# Patient Record
Sex: Male | Born: 1950
Health system: Southern US, Community
[De-identification: ages and names within clinical notes are randomized; demographics above are authoritative.]

## PROBLEM LIST (undated history)

## (undated) DIAGNOSIS — Z87442 Personal history of urinary calculi: Secondary | ICD-10-CM

## (undated) DIAGNOSIS — E785 Hyperlipidemia, unspecified: Secondary | ICD-10-CM

## (undated) DIAGNOSIS — R011 Cardiac murmur, unspecified: Secondary | ICD-10-CM

## (undated) DIAGNOSIS — I219 Acute myocardial infarction, unspecified: Secondary | ICD-10-CM

## (undated) DIAGNOSIS — I2510 Atherosclerotic heart disease of native coronary artery without angina pectoris: Secondary | ICD-10-CM

## (undated) DIAGNOSIS — E119 Type 2 diabetes mellitus without complications: Secondary | ICD-10-CM

## (undated) DIAGNOSIS — N189 Chronic kidney disease, unspecified: Secondary | ICD-10-CM

## (undated) DIAGNOSIS — Z905 Acquired absence of kidney: Secondary | ICD-10-CM

## (undated) DIAGNOSIS — I519 Heart disease, unspecified: Secondary | ICD-10-CM

## (undated) DIAGNOSIS — I10 Essential (primary) hypertension: Secondary | ICD-10-CM

## (undated) DIAGNOSIS — M109 Gout, unspecified: Secondary | ICD-10-CM

## (undated) HISTORY — PX: NEPHRECTOMY: SHX65

## (undated) HISTORY — PX: CORONARY ANGIOPLASTY WITH STENT PLACEMENT: SHX49

## (undated) HISTORY — DX: Heart disease, unspecified: I51.9

## (undated) HISTORY — DX: Essential (primary) hypertension: I10

## (undated) HISTORY — PX: EYE SURGERY: SHX253

## (undated) HISTORY — DX: Type 2 diabetes mellitus without complications: E11.9

## (undated) HISTORY — DX: Gout, unspecified: M10.9

---

## 2016-06-07 DIAGNOSIS — I2583 Coronary atherosclerosis due to lipid rich plaque: Secondary | ICD-10-CM | POA: Diagnosis not present

## 2016-06-07 DIAGNOSIS — E785 Hyperlipidemia, unspecified: Secondary | ICD-10-CM | POA: Diagnosis not present

## 2016-06-07 DIAGNOSIS — I10 Essential (primary) hypertension: Secondary | ICD-10-CM | POA: Diagnosis not present

## 2016-06-21 DIAGNOSIS — Z0001 Encounter for general adult medical examination with abnormal findings: Secondary | ICD-10-CM | POA: Diagnosis not present

## 2016-06-21 DIAGNOSIS — E782 Mixed hyperlipidemia: Secondary | ICD-10-CM | POA: Diagnosis not present

## 2016-06-21 DIAGNOSIS — Z1211 Encounter for screening for malignant neoplasm of colon: Secondary | ICD-10-CM | POA: Diagnosis not present

## 2016-06-21 DIAGNOSIS — I10 Essential (primary) hypertension: Secondary | ICD-10-CM | POA: Diagnosis not present

## 2016-07-12 ENCOUNTER — Other Ambulatory Visit: Payer: Self-pay

## 2016-08-17 ENCOUNTER — Ambulatory Visit: Payer: PPO | Admitting: Urology

## 2016-08-17 ENCOUNTER — Encounter: Payer: Self-pay | Admitting: Urology

## 2016-08-17 VITALS — BP 150/81 | HR 88 | Ht 70.0 in | Wt 186.8 lb

## 2016-08-17 DIAGNOSIS — Z125 Encounter for screening for malignant neoplasm of prostate: Secondary | ICD-10-CM | POA: Diagnosis not present

## 2016-08-17 DIAGNOSIS — R3129 Other microscopic hematuria: Secondary | ICD-10-CM

## 2016-08-17 DIAGNOSIS — N469 Male infertility, unspecified: Secondary | ICD-10-CM

## 2016-08-17 DIAGNOSIS — N401 Enlarged prostate with lower urinary tract symptoms: Secondary | ICD-10-CM

## 2016-08-17 DIAGNOSIS — N200 Calculus of kidney: Secondary | ICD-10-CM | POA: Diagnosis not present

## 2016-08-17 LAB — BLADDER SCAN AMB NON-IMAGING: SCAN RESULT: 14

## 2016-08-17 NOTE — Progress Notes (Signed)
08/17/2016 3:37 PM   Zachary Gordon [birth year] [id number]  Referring provider: Lavera Guise, [place],  [id number]  Chief Complaint  Patient presents with  . Benign Prostatic Hypertrophy     HPI: The patient is a 66 year old gentle man with a past medical history of left nephrectomy and BPH on finasteride presents today with difficulty urinating.    1. BPH The patient notes a weak stream, urinary frequency, nocturia, intermittency, and straining to urinate. He has been on finasteride until 1 month ago when he stopped it as he thought it may be impairing his ability to have a child with his wife. He has never tried Flomax before. He does feel this bothers his quality of life overall.  2. Infertility The patient has been trying to conceive a child with his wife for 7 years. She has been worked up and was told that she is normal. She is 65 years of age. He has had a semen analysis in the past and was told that he had reduced sperm counts. They have tried in vitro fertilization 3 without success. He was on finasteride until approximately one month ago when he stopped his he thought it is impairing his chances of fertility. He has noticed that his semen has started to get thicker and less clear since stopping this medication.  3. History of nephrolithiasis This had multiple surgeries including open pilonidal lithotomy resulting loss of left kidney. His most recent stone episodes in 2010 which was treated with a stent followed by lithotripsy. His urinalysis today does show calcium oxalate crystals as well as 3-10 red blood cells.  4. History of left nephrectomy His left kidney was removed as result of an iatrogenic genic injury during stone surgery many years ago. It was not removed for malignant reasons.  5. Prostate cancer screening PSA was 0.6 in May 2018.  PMH: Past Medical History:  Diagnosis Date  . Gout   . Heart disease   . Hypertension     Surgical  History: Past Surgical History:  Procedure Laterality Date  . CORONARY ANGIOPLASTY WITH STENT PLACEMENT    . NEPHRECTOMY Left     Home Medications:  Allergies as of 08/17/2016   No Known Allergies     Medication List       Accurate as of 08/17/16  3:37 PM. Always use your most recent med list.          amLODipine 5 MG tablet Commonly known as:  NORVASC   carvedilol 6.25 MG tablet Commonly known as:  COREG   finasteride 5 MG tablet Commonly known as:  PROSCAR   lisinopril 10 MG tablet Commonly known as:  PRINIVIL,ZESTRIL   rosuvastatin 20 MG tablet Commonly known as:  CRESTOR       Allergies: No Known Allergies  Family History: Family History  Problem Relation Age of Onset  . Prostate cancer Neg Hx   . Bladder Cancer Neg Hx   . Kidney cancer Neg Hx     Social History:  reports that he has never smoked. He has never used smokeless tobacco. He reports that he does not drink alcohol or use drugs.  ROS: UROLOGY Frequent Urination?: Yes Hard to postpone urination?: Yes Burning/pain with urination?: No Get up at night to urinate?: Yes Leakage of urine?: No Urine stream starts and stops?: Yes Trouble starting stream?: No Do you have to strain to urinate?: Yes Blood in urine?: No Urinary tract infection?: No Sexually transmitted disease?: No  Injury to kidneys or bladder?: No Painful intercourse?: No Weak stream?: Yes Erection problems?: No Penile pain?: No  Gastrointestinal Nausea?: No Vomiting?: No Indigestion/heartburn?: No Diarrhea?: No Constipation?: No  Constitutional Fever: No Night sweats?: No Weight loss?: No Fatigue?: No  Skin Skin rash/lesions?: No Itching?: No  Eyes Blurred vision?: No Double vision?: No  Ears/Nose/Throat Sore throat?: No Sinus problems?: No  Hematologic/Lymphatic Swollen glands?: No Easy bruising?: No  Cardiovascular Leg swelling?: No Chest pain?: No  Respiratory Cough?: No Shortness of breath?:  No  Endocrine Excessive thirst?: No  Musculoskeletal Back pain?: No Joint pain?: No  Neurological Headaches?: No Dizziness?: No  Psychologic Depression?: No Anxiety?: No  Physical Exam: BP (!) 150/81 (BP Location: Left Arm, Patient Position: Sitting, Cuff Size: Normal)   Pulse 88   Ht 5\' 10"  (1.778 m)   Wt 186 lb 12.8 oz (84.7 kg)   BMI 26.80 kg/m   Constitutional:  Alert and oriented, No acute distress. HEENT: [place] AT, moist mucus membranes.  Trachea midline, no masses. Cardiovascular: No clubbing, cyanosis, or edema. Respiratory: Normal respiratory effort, no increased work of breathing. GI: Abdomen is soft, nontender, nondistended, no abdominal masses GU: No CVA tenderness. Normal phallus. Testicles somewhat small but benign. DRE 30 g benign. Skin: No rashes, bruises or suspicious lesions. Lymph: No cervical or inguinal adenopathy. Neurologic: Grossly intact, no focal deficits, moving all 4 extremities. Psychiatric: Normal mood and affect.  Laboratory Data: No results found for: WBC, HGB, HCT, MCV, PLT  No results found for: CREATININE  No results found for: PSA  No results found for: TESTOSTERONE  No results found for: HGBA1C  Urinalysis No results found for: COLORURINE, APPEARANCEUR, LABSPEC, PHURINE, GLUCOSEU, HGBUR, BILIRUBINUR, KETONESUR, PROTEINUR, UROBILINOGEN, NITRITE, LEUKOCYTESUR  Assessment & Plan:    1. BPH I have recommended against any medical or surgical management of this currently as this would further impair his fertility. We will address this once he is able to conceive a child or when he no longer desires conception.  2. Infertility I discussed the patient that his finasteride certainly was not helping with his fertility issues. We discussed that finasteride takes up to 6 months for its affects to completely cease. We will have him obtain a complete semen analysis in 5 months in follow-up in 6 months to discuss the results. Hopefully  stopping finasteride will help with his fertility.  3. History of nephrolithiasis The patient has calcium oxalate crystals as well as microscopic hematuria on his urinalysis today. He is high risk for nephrolithiasis given his solitary kidney. We'll obtain a renal ultrasound prior to follow-up.  4. Microscopic hematuria Likely related to known calcium oxalate crystals. If he does not have evidence of nephrolithiasis on his ultrasound, we will proceed with a more formal workup.  5. Prostate cancer screening Up-to-date  Return in about 6 months (around 02/16/2017) for semen analysis and renal u/s prior.  Nickie Retort, MD  [facility] [address] [place], [place] [address] [phone]

## 2016-08-18 LAB — MICROSCOPIC EXAMINATION

## 2016-08-18 LAB — URINALYSIS, COMPLETE
Bilirubin, UA: NEGATIVE
Glucose, UA: NEGATIVE
Ketones, UA: NEGATIVE
LEUKOCYTES UA: NEGATIVE
Nitrite, UA: NEGATIVE
Specific Gravity, UA: >1.03 — ABNORMAL HIGH (ref 1.005–1.030)
Urobilinogen, Ur: 0.2 mg/dL (ref 0.2–1.0)
pH, UA: 5 (ref 5.0–7.5)

## 2016-11-21 DIAGNOSIS — R010 Benign and innocent cardiac murmurs: Secondary | ICD-10-CM | POA: Diagnosis not present

## 2016-11-21 DIAGNOSIS — M109 Gout, unspecified: Secondary | ICD-10-CM | POA: Diagnosis not present

## 2016-11-21 DIAGNOSIS — I10 Essential (primary) hypertension: Secondary | ICD-10-CM | POA: Diagnosis not present

## 2016-11-21 DIAGNOSIS — I2510 Atherosclerotic heart disease of native coronary artery without angina pectoris: Secondary | ICD-10-CM | POA: Diagnosis not present

## 2016-11-22 DIAGNOSIS — E782 Mixed hyperlipidemia: Secondary | ICD-10-CM | POA: Diagnosis not present

## 2016-11-22 DIAGNOSIS — R0602 Shortness of breath: Secondary | ICD-10-CM | POA: Diagnosis not present

## 2016-11-22 DIAGNOSIS — R010 Benign and innocent cardiac murmurs: Secondary | ICD-10-CM | POA: Diagnosis not present

## 2016-11-22 DIAGNOSIS — I10 Essential (primary) hypertension: Secondary | ICD-10-CM | POA: Diagnosis not present

## 2016-11-22 DIAGNOSIS — Z9861 Coronary angioplasty status: Secondary | ICD-10-CM | POA: Diagnosis not present

## 2016-11-22 DIAGNOSIS — R9431 Abnormal electrocardiogram [ECG] [EKG]: Secondary | ICD-10-CM | POA: Diagnosis not present

## 2016-12-01 DIAGNOSIS — R079 Chest pain, unspecified: Secondary | ICD-10-CM | POA: Diagnosis not present

## 2016-12-23 DIAGNOSIS — R010 Benign and innocent cardiac murmurs: Secondary | ICD-10-CM | POA: Diagnosis not present

## 2016-12-23 DIAGNOSIS — I2510 Atherosclerotic heart disease of native coronary artery without angina pectoris: Secondary | ICD-10-CM | POA: Diagnosis not present

## 2016-12-23 DIAGNOSIS — J019 Acute sinusitis, unspecified: Secondary | ICD-10-CM | POA: Diagnosis not present

## 2016-12-23 DIAGNOSIS — I10 Essential (primary) hypertension: Secondary | ICD-10-CM | POA: Diagnosis not present

## 2016-12-23 DIAGNOSIS — M109 Gout, unspecified: Secondary | ICD-10-CM | POA: Diagnosis not present

## 2016-12-30 DIAGNOSIS — H26491 Other secondary cataract, right eye: Secondary | ICD-10-CM | POA: Diagnosis not present

## 2017-01-03 DIAGNOSIS — I10 Essential (primary) hypertension: Secondary | ICD-10-CM | POA: Diagnosis not present

## 2017-01-03 DIAGNOSIS — Z1211 Encounter for screening for malignant neoplasm of colon: Secondary | ICD-10-CM | POA: Diagnosis not present

## 2017-01-09 ENCOUNTER — Encounter: Payer: Self-pay | Admitting: *Deleted

## 2017-01-10 ENCOUNTER — Encounter: Payer: Self-pay | Admitting: *Deleted

## 2017-01-10 ENCOUNTER — Ambulatory Visit: Payer: PPO | Admitting: Anesthesiology

## 2017-01-10 ENCOUNTER — Ambulatory Visit
Admission: RE | Admit: 2017-01-10 | Discharge: 2017-01-10 | Disposition: A | Discharge status: Home / Self Care | Payer: PPO | Source: Ambulatory Visit | Attending: Internal Medicine | Admitting: Internal Medicine

## 2017-01-10 ENCOUNTER — Encounter
Admission: RE | Disposition: A | Discharge status: Home / Self Care | Payer: Self-pay | Source: Ambulatory Visit | Attending: Internal Medicine

## 2017-01-10 DIAGNOSIS — Z87442 Personal history of urinary calculi: Secondary | ICD-10-CM | POA: Diagnosis not present

## 2017-01-10 DIAGNOSIS — Z7982 Long term (current) use of aspirin: Secondary | ICD-10-CM | POA: Insufficient documentation

## 2017-01-10 DIAGNOSIS — Z79899 Other long term (current) drug therapy: Secondary | ICD-10-CM | POA: Insufficient documentation

## 2017-01-10 DIAGNOSIS — Z6826 Body mass index (BMI) 26.0-26.9, adult: Secondary | ICD-10-CM | POA: Diagnosis not present

## 2017-01-10 DIAGNOSIS — D125 Benign neoplasm of sigmoid colon: Secondary | ICD-10-CM | POA: Diagnosis not present

## 2017-01-10 DIAGNOSIS — I2510 Atherosclerotic heart disease of native coronary artery without angina pectoris: Secondary | ICD-10-CM | POA: Diagnosis not present

## 2017-01-10 DIAGNOSIS — N189 Chronic kidney disease, unspecified: Secondary | ICD-10-CM | POA: Diagnosis not present

## 2017-01-10 DIAGNOSIS — Z880 Allergy status to penicillin: Secondary | ICD-10-CM | POA: Diagnosis not present

## 2017-01-10 DIAGNOSIS — I129 Hypertensive chronic kidney disease with stage 1 through stage 4 chronic kidney disease, or unspecified chronic kidney disease: Secondary | ICD-10-CM | POA: Diagnosis not present

## 2017-01-10 DIAGNOSIS — E7800 Pure hypercholesterolemia, unspecified: Secondary | ICD-10-CM | POA: Insufficient documentation

## 2017-01-10 DIAGNOSIS — K648 Other hemorrhoids: Secondary | ICD-10-CM | POA: Diagnosis not present

## 2017-01-10 DIAGNOSIS — E669 Obesity, unspecified: Secondary | ICD-10-CM | POA: Insufficient documentation

## 2017-01-10 DIAGNOSIS — I252 Old myocardial infarction: Secondary | ICD-10-CM | POA: Insufficient documentation

## 2017-01-10 DIAGNOSIS — Z1211 Encounter for screening for malignant neoplasm of colon: Secondary | ICD-10-CM | POA: Diagnosis not present

## 2017-01-10 DIAGNOSIS — Z905 Acquired absence of kidney: Secondary | ICD-10-CM | POA: Diagnosis not present

## 2017-01-10 DIAGNOSIS — K640 First degree hemorrhoids: Secondary | ICD-10-CM | POA: Insufficient documentation

## 2017-01-10 DIAGNOSIS — K635 Polyp of colon: Secondary | ICD-10-CM | POA: Diagnosis not present

## 2017-01-10 DIAGNOSIS — D123 Benign neoplasm of transverse colon: Secondary | ICD-10-CM | POA: Insufficient documentation

## 2017-01-10 DIAGNOSIS — Z955 Presence of coronary angioplasty implant and graft: Secondary | ICD-10-CM | POA: Insufficient documentation

## 2017-01-10 HISTORY — DX: Acquired absence of kidney: Z90.5

## 2017-01-10 HISTORY — DX: Chronic kidney disease, unspecified: N18.9

## 2017-01-10 HISTORY — PX: COLONOSCOPY WITH PROPOFOL: SHX5780

## 2017-01-10 HISTORY — DX: Atherosclerotic heart disease of native coronary artery without angina pectoris: I25.10

## 2017-01-10 SURGERY — COLONOSCOPY WITH PROPOFOL
Anesthesia: General

## 2017-01-10 MED ORDER — MIDAZOLAM HCL 2 MG/2ML IJ SOLN
INTRAMUSCULAR | Status: DC | PRN
  Administered 2017-01-10 (×2): 1 mg via INTRAVENOUS

## 2017-01-10 MED ORDER — PROPOFOL 10 MG/ML IV BOLUS
INTRAVENOUS | Status: DC | PRN
Start: 2017-01-10 — End: 2017-01-10
  Administered 2017-01-10: 30 mg via INTRAVENOUS

## 2017-01-10 MED ORDER — PROPOFOL 500 MG/50ML IV EMUL
INTRAVENOUS | Status: DC | PRN
  Administered 2017-01-10: 120 ug/kg/min via INTRAVENOUS

## 2017-01-10 MED ORDER — MIDAZOLAM HCL 2 MG/2ML IJ SOLN
INTRAMUSCULAR | Status: AC
  Filled 2017-01-10: qty 2

## 2017-01-10 MED ORDER — FENTANYL CITRATE (PF) 100 MCG/2ML IJ SOLN
INTRAMUSCULAR | Status: DC | PRN
  Administered 2017-01-10 (×2): 50 ug via INTRAVENOUS

## 2017-01-10 MED ORDER — SODIUM CHLORIDE 0.9 % IV SOLN
INTRAVENOUS | Status: DC
  Administered 2017-01-10: 1000 mL via INTRAVENOUS

## 2017-01-10 MED ORDER — FENTANYL CITRATE (PF) 100 MCG/2ML IJ SOLN
INTRAMUSCULAR | Status: AC
  Filled 2017-01-10: qty 2

## 2017-01-10 MED ORDER — PROPOFOL 10 MG/ML IV BOLUS
INTRAVENOUS | Status: AC
  Filled 2017-01-10: qty 20

## 2017-01-10 NOTE — Anesthesia Post-op Follow-up Note (Signed)
Anesthesia QCDR form completed.        

## 2017-01-10 NOTE — Transfer of Care (Signed)
Immediate Anesthesia Transfer of Care Note  Patient: Zachary Gordon  Procedure(s) Performed: COLONOSCOPY WITH PROPOFOL (N/A )  Patient Location: PACU  Anesthesia Type:General  Level of Consciousness: awake  Airway & Oxygen Therapy: Patient Spontanous Breathing and Patient connected to nasal cannula oxygen  Post-op Assessment: Report given to RN and Post -op Vital signs reviewed and stable  Post vital signs: Reviewed  Last Vitals:  Vitals:   01/10/17 0813  BP: 135/88  Pulse: 82  Resp: 18  Temp: 36.6 C  SpO2: 99%    Last Pain:  Vitals:   01/10/17 0813  TempSrc: Tympanic         Complications: No apparent anesthesia complications

## 2017-01-10 NOTE — Anesthesia Postprocedure Evaluation (Signed)
Anesthesia Post Note  Patient: Zachary Gordon  Procedure(s) Performed: COLONOSCOPY WITH PROPOFOL (N/A )  Patient location during evaluation: PACU Anesthesia Type: General Level of consciousness: awake Pain management: pain level controlled Vital Signs Assessment: post-procedure vital signs reviewed and stable Respiratory status: nonlabored ventilation Cardiovascular status: stable Anesthetic complications: no     Last Vitals:  Vitals:   01/10/17 1010 01/10/17 1020  BP: 120/82 131/79  Pulse: 75 74  Resp: 18 18  Temp:    SpO2: 96% 96%    Last Pain:  Vitals:   01/10/17 0950  TempSrc: Tympanic                 VAN STAVEREN,Suzanna Zahn

## 2017-01-10 NOTE — Anesthesia Preprocedure Evaluation (Signed)
Anesthesia Evaluation  Patient identified by MRN, date of birth, ID band Patient awake    Reviewed: Allergy & Precautions  Airway Mallampati: II       Dental  (+) Teeth Intact   Pulmonary neg pulmonary ROS,    breath sounds clear to auscultation       Cardiovascular Exercise Tolerance: Good hypertension, Pt. on medications + CAD, + Past MI and + Cardiac Stents   Rhythm:Regular Rate:Normal     Neuro/Psych negative neurological ROS  negative psych ROS   GI/Hepatic negative GI ROS, Neg liver ROS,   Endo/Other  negative endocrine ROS  Renal/GU      Musculoskeletal negative musculoskeletal ROS (+)   Abdominal (+) + obese,   Peds negative pediatric ROS (+)  Hematology negative hematology ROS (+)   Anesthesia Other Findings   Reproductive/Obstetrics                             Anesthesia Physical Anesthesia Plan  ASA: II  Anesthesia Plan: General   Post-op Pain Management:    Induction: Intravenous  PONV Risk Score and Plan: 0  Airway Management Planned: Natural Airway and Nasal Cannula  Additional Equipment:   Intra-op Plan:   Post-operative Plan:   Informed Consent: I have reviewed the patients History and Physical, chart, labs and discussed the procedure including the risks, benefits and alternatives for the proposed anesthesia with the patient or authorized representative who has indicated his/her understanding and acceptance.     Plan Discussed with: Surgeon  Anesthesia Plan Comments:         Anesthesia Quick Evaluation

## 2017-01-10 NOTE — Op Note (Signed)
[facility] Gastroenterology Patient Name: Zachary Gordon Procedure Date: 01/10/2017 9:21 AM MRN: [id number] Account #: [id number] Date of Birth: [birth year] Admit Type: Outpatient Age: 66 Room: [facility] ENDO ROOM 1 Gender: Male Note Status: Finalized Procedure:            Colonoscopy Indications:          Screening for colorectal malignant neoplasm Providers:            Benay Pike. Ashantae Pangallo MD, MD Medicines:            Propofol per Anesthesia Complications:        No immediate complications. Procedure:            Pre-Anesthesia Assessment:                       - The risks and benefits of the procedure and the                        sedation options and risks were discussed with the                        patient. All questions were answered and informed                        consent was obtained.                       - Patient identification and proposed procedure were                        verified prior to the procedure by the nurse. The                        procedure was verified in the procedure room.                       - ASA Grade Assessment: II - A patient with mild                        systemic disease.                       - After reviewing the risks and benefits, the patient                        was deemed in satisfactory condition to undergo the                        procedure.                       After obtaining informed consent, the colonoscope was                        passed under direct vision. Throughout the procedure,                        the patient's blood pressure, pulse, and oxygen                        saturations were monitored continuously. The  Colonoscope was introduced through the anus and                        advanced to the the cecum, identified by appendiceal                        orifice and ileocecal valve. The colonoscopy was                        performed without difficulty. The patient  tolerated the                        procedure well. The ileocecal valve, appendiceal                        orifice, and rectum were photographed. The quality of                        the bowel preparation was good. Findings:      The perianal and digital rectal examinations were normal. Pertinent       negatives include normal sphincter tone and no palpable rectal lesions.      A 5 mm polyp was found in the proximal transverse colon. The polyp was       sessile. The polyp was removed with a hot snare. Resection and retrieval       were complete.      A 13 mm polyp was found in the mid sigmoid colon. The polyp was       semi-pedunculated. The polyp was removed with a hot snare. Resection and       retrieval were complete.      Non-bleeding internal hemorrhoids were found during retroflexion. The       hemorrhoids were mild, small and Grade I (internal hemorrhoids that do       not prolapse).      The exam was otherwise without abnormality. Impression:           - One 5 mm polyp in the proximal transverse colon,                        removed with a hot snare. Resected and retrieved.                       - One 13 mm polyp in the mid sigmoid colon, removed                        with a hot snare. Resected and retrieved.                       - Non-bleeding internal hemorrhoids.                       - The examination was otherwise normal. Recommendation:       - Patient has a contact number available for                        emergencies. The signs and symptoms of potential                        delayed complications were discussed with the  patient.                        Return to normal activities tomorrow. Written discharge                        instructions were provided to the patient.                       - Resume previous diet.                       - Continue present medications.                       - Await pathology results.                       - Repeat colonoscopy  for surveillance based on                        pathology results.                       - Return to GI office PRN.                       - The findings and recommendations were discussed with                        the patient and their spouse. Procedure Code(s):    --- Professional ---                       [phone], Colonoscopy, flexible; with removal of tumor(s),                        polyp(s), or other lesion(s) by snare technique Diagnosis Code(s):    --- Professional ---                       Z12.11, Encounter for screening for malignant neoplasm                        of colon                       D12.3, Benign neoplasm of transverse colon (hepatic                        flexure or splenic flexure)                       D12.5, Benign neoplasm of sigmoid colon                       K64.0, First degree hemorrhoids CPT copyright 2016 [employer]. All rights reserved. The codes documented in this report are preliminary and upon coder review may  be revised to meet current compliance requirements. Efrain Sella MD, MD 01/10/2017 9:55:54 AM This report has been signed electronically. Number of Addenda: 0 Note Initiated On: 01/10/2017 9:21 AM Scope Withdrawal Time: 0 hours 11 minutes 21 seconds  Total Procedure Duration: 0 hours 17 minutes 40 seconds       [facility]  Center

## 2017-01-10 NOTE — H&P (Signed)
Outpatient short stay form Pre-procedure 01/10/2017 9:22 AM Teodoro K. Alice Reichert, M.D.  Primary Physician: Pernell Dupre, M.D.  Reason for visit:  Colon cancer screening.  History of present illness:  Patient is a pleasant 66 year old male presenting for colon cancer screening. He denies any abdominal pain, family history of colon cancer or polyps, weight loss or rectal bleeding.    Current Facility-Administered Medications:  .  0.9 %  sodium chloride infusion, , Intravenous, Continuous, [place], Benay Pike, MD, Last Rate: 20 mL/hr at 01/10/17 0828, 1,000 mL at 01/10/17 9179  Medications Prior to Admission  Medication Sig Dispense Refill Last Dose  . amLODipine (NORVASC) 5 MG tablet    01/09/2017 at Unknown time  . aspirin EC 81 MG tablet Take 81 mg daily by mouth.   Past Month at Unknown time  . carvedilol (COREG) 6.25 MG tablet    01/09/2017 at Unknown time  . lisinopril (PRINIVIL,ZESTRIL) 10 MG tablet    01/09/2017 at Unknown time  . rosuvastatin (CRESTOR) 20 MG tablet    01/09/2017 at Unknown time  . finasteride (PROSCAR) 5 MG tablet    Not Taking at Unknown time     Allergies  Allergen Reactions  . Penicillins      Past Medical History:  Diagnosis Date  . Chronic kidney disease    stones  . Coronary artery disease    heart stent  . Gout   . Heart disease   . History of nephrectomy   . Hypertension     Review of systems:      Physical Exam  General appearance: alert, cooperative and appears stated age Resp: clear to auscultation bilaterally Cardio: regular rate and rhythm, S1, S2 normal, no murmur, click, rub or gallop GI: soft, non-tender; bowel sounds normal; no masses,  no organomegaly Extremities: extremities normal, atraumatic, no cyanosis or edema     Planned procedures: Colonoscopy. The patient understands the nature of the planned procedure, indications, risks, alternatives and potential complications including but not limited to bleeding,  infection, perforation, damage to internal organs and possible oversedation/side effects from anesthesia. The patient agrees and gives consent to proceed.  Please refer to procedure notes for findings, recommendations and patient disposition/instructions.    Teodoro K. Alice Reichert, M.D. Gastroenterology 01/10/2017  9:22 AM

## 2017-01-11 ENCOUNTER — Encounter: Payer: Self-pay | Admitting: Internal Medicine

## 2017-01-11 LAB — SURGICAL PATHOLOGY

## 2017-01-23 ENCOUNTER — Emergency Department: Payer: PPO

## 2017-01-23 ENCOUNTER — Inpatient Hospital Stay
Admission: EM | Admit: 2017-01-23 | Discharge: 2017-01-24 | DRG: 282 | Disposition: A | Discharge status: Home / Self Care | Payer: PPO | Attending: Specialist | Admitting: Specialist

## 2017-01-23 ENCOUNTER — Other Ambulatory Visit: Payer: Self-pay

## 2017-01-23 ENCOUNTER — Encounter: Payer: Self-pay | Admitting: *Deleted

## 2017-01-23 DIAGNOSIS — M109 Gout, unspecified: Secondary | ICD-10-CM | POA: Diagnosis present

## 2017-01-23 DIAGNOSIS — I214 Non-ST elevation (NSTEMI) myocardial infarction: Secondary | ICD-10-CM | POA: Diagnosis not present

## 2017-01-23 DIAGNOSIS — Z880 Allergy status to penicillin: Secondary | ICD-10-CM

## 2017-01-23 DIAGNOSIS — Z7982 Long term (current) use of aspirin: Secondary | ICD-10-CM

## 2017-01-23 DIAGNOSIS — Z23 Encounter for immunization: Secondary | ICD-10-CM

## 2017-01-23 DIAGNOSIS — N400 Enlarged prostate without lower urinary tract symptoms: Secondary | ICD-10-CM | POA: Diagnosis present

## 2017-01-23 DIAGNOSIS — R739 Hyperglycemia, unspecified: Secondary | ICD-10-CM | POA: Diagnosis present

## 2017-01-23 DIAGNOSIS — I2510 Atherosclerotic heart disease of native coronary artery without angina pectoris: Secondary | ICD-10-CM | POA: Diagnosis present

## 2017-01-23 DIAGNOSIS — Z79899 Other long term (current) drug therapy: Secondary | ICD-10-CM

## 2017-01-23 DIAGNOSIS — I10 Essential (primary) hypertension: Secondary | ICD-10-CM | POA: Diagnosis present

## 2017-01-23 DIAGNOSIS — Z955 Presence of coronary angioplasty implant and graft: Secondary | ICD-10-CM

## 2017-01-23 DIAGNOSIS — Z905 Acquired absence of kidney: Secondary | ICD-10-CM

## 2017-01-23 DIAGNOSIS — R0602 Shortness of breath: Secondary | ICD-10-CM | POA: Diagnosis not present

## 2017-01-23 DIAGNOSIS — R079 Chest pain, unspecified: Secondary | ICD-10-CM | POA: Diagnosis not present

## 2017-01-23 DIAGNOSIS — E785 Hyperlipidemia, unspecified: Secondary | ICD-10-CM | POA: Diagnosis not present

## 2017-01-23 HISTORY — DX: Hyperlipidemia, unspecified: E78.5

## 2017-01-23 LAB — COMPREHENSIVE METABOLIC PANEL
ALBUMIN: 4.2 g/dL (ref 3.5–5.0)
ALK PHOS: 85 U/L (ref 38–126)
ALT: 14 U/L — AB (ref 17–63)
ANION GAP: 8 (ref 5–15)
AST: 22 U/L (ref 15–41)
BUN: 14 mg/dL (ref 6–20)
CHLORIDE: 104 mmol/L (ref 101–111)
CO2: 26 mmol/L (ref 22–32)
CREATININE: 1.02 mg/dL (ref 0.61–1.24)
Calcium: 9.5 mg/dL (ref 8.9–10.3)
GFR calc non Af Amer: >60 mL/min (ref >60)
GLUCOSE: 161 mg/dL — AB (ref 65–99)
Potassium: 4.8 mmol/L (ref 3.5–5.1)
SODIUM: 138 mmol/L (ref 135–145)
Total Bilirubin: 1.6 mg/dL — ABNORMAL HIGH (ref 0.3–1.2)
Total Protein: 7 g/dL (ref 6.5–8.1)

## 2017-01-23 LAB — CBC
HEMATOCRIT: 49.6 % (ref 40.0–52.0)
HEMOGLOBIN: 16.5 g/dL (ref 13.0–18.0)
MCH: 25.9 pg — ABNORMAL LOW (ref 26.0–34.0)
MCHC: 33.2 g/dL (ref 32.0–36.0)
MCV: 77.9 fL — ABNORMAL LOW (ref 80.0–100.0)
Platelets: 218 10*3/uL (ref 150–440)
RBC: 6.37 MIL/uL — ABNORMAL HIGH (ref 4.40–5.90)
RDW: 14.6 % — ABNORMAL HIGH (ref 11.5–14.5)
WBC: 6.1 10*3/uL (ref 3.8–10.6)

## 2017-01-23 LAB — TROPONIN I: Troponin I: 0.03 ng/mL (ref <0.03)

## 2017-01-23 MED ORDER — ASPIRIN 81 MG PO CHEW
324.0000 mg | CHEWABLE_TABLET | Freq: Once | ORAL | Status: AC
  Administered 2017-01-23: 324 mg via ORAL
  Filled 2017-01-23: qty 4

## 2017-01-23 MED ORDER — HEPARIN BOLUS VIA INFUSION
4000.0000 [IU] | Freq: Once | INTRAVENOUS | Status: AC
  Administered 2017-01-23: 4000 [IU] via INTRAVENOUS
  Filled 2017-01-23: qty 4000

## 2017-01-23 MED ORDER — HEPARIN (PORCINE) IN NACL 100-0.45 UNIT/ML-% IJ SOLN
950.0000 [IU]/h | INTRAMUSCULAR | Status: DC
  Administered 2017-01-23: 950 [IU]/h via INTRAVENOUS
  Filled 2017-01-23: qty 250

## 2017-01-23 MED ORDER — ASPIRIN EC 81 MG PO TBEC: 81.0000 mg | DELAYED_RELEASE_TABLET | Freq: Every day | ORAL | Status: DC

## 2017-01-23 MED ORDER — ROSUVASTATIN CALCIUM 10 MG PO TABS
40.0000 mg | ORAL_TABLET | Freq: Every day | ORAL | Status: DC
  Administered 2017-01-24: 40 mg via ORAL
  Filled 2017-01-23: qty 4

## 2017-01-23 MED ORDER — ACETAMINOPHEN 325 MG PO TABS: 650.0000 mg | ORAL_TABLET | ORAL | Status: DC | PRN

## 2017-01-23 MED ORDER — ONDANSETRON HCL 4 MG/2ML IJ SOLN: 4.0000 mg | Freq: Four times a day (QID) | INTRAMUSCULAR | Status: DC | PRN

## 2017-01-23 MED ORDER — NITROGLYCERIN 0.4 MG SL SUBL
0.4000 mg | SUBLINGUAL_TABLET | SUBLINGUAL | Status: DC | PRN
  Administered 2017-01-23 (×2): 0.4 mg via SUBLINGUAL
  Filled 2017-01-23: qty 1

## 2017-01-23 MED ORDER — NITROGLYCERIN IN D5W 200-5 MCG/ML-% IV SOLN
0.0000 ug/min | Freq: Once | INTRAVENOUS | Status: AC
  Administered 2017-01-23: 5 ug/min via INTRAVENOUS
  Filled 2017-01-23: qty 250

## 2017-01-23 MED ORDER — CLOPIDOGREL BISULFATE 75 MG PO TABS
300.0000 mg | ORAL_TABLET | Freq: Once | ORAL | Status: AC
Start: 2017-01-23 — End: 2017-01-23
  Administered 2017-01-23: 300 mg via ORAL
  Filled 2017-01-23: qty 4

## 2017-01-23 MED ORDER — SODIUM CHLORIDE 0.9 % IV SOLN
INTRAVENOUS | Status: DC
  Administered 2017-01-23: 21:00:00 via INTRAVENOUS

## 2017-01-23 MED ORDER — MORPHINE SULFATE (PF) 2 MG/ML IV SOLN: 2.0000 mg | INTRAVENOUS | Status: DC | PRN

## 2017-01-23 MED ORDER — FINASTERIDE 5 MG PO TABS
5.0000 mg | ORAL_TABLET | Freq: Every day | ORAL | Status: DC
  Administered 2017-01-24: 5 mg via ORAL
  Filled 2017-01-23: qty 1

## 2017-01-23 MED ORDER — ASPIRIN EC 81 MG PO TBEC
81.0000 mg | DELAYED_RELEASE_TABLET | Freq: Every day | ORAL | Status: DC
  Administered 2017-01-24: 81 mg via ORAL
  Filled 2017-01-23: qty 1

## 2017-01-23 MED ORDER — NITROGLYCERIN 2 % TD OINT
0.5000 [in_us] | TOPICAL_OINTMENT | Freq: Once | TRANSDERMAL | Status: AC
  Administered 2017-01-23: 0.5 [in_us] via TOPICAL
  Filled 2017-01-23: qty 1

## 2017-01-23 MED ORDER — CARVEDILOL 6.25 MG PO TABS
6.2500 mg | ORAL_TABLET | Freq: Two times a day (BID) | ORAL | Status: DC
  Administered 2017-01-24 (×2): 6.25 mg via ORAL
  Filled 2017-01-23 (×2): qty 1

## 2017-01-23 MED ORDER — NITROGLYCERIN 0.4 MG SL SUBL: 0.4000 mg | SUBLINGUAL_TABLET | SUBLINGUAL | Status: DC | PRN

## 2017-01-23 MED ORDER — NITROGLYCERIN IN D5W 200-5 MCG/ML-% IV SOLN
0.0000 ug/min | INTRAVENOUS | Status: DC
  Administered 2017-01-23: 15 ug/min via INTRAVENOUS
  Administered 2017-01-24: 10 ug/min via INTRAVENOUS

## 2017-01-23 NOTE — ED Provider Notes (Addendum)
[facility] Emergency Department Provider Note  ____________________________________________  Time seen: Approximately 3:49 PM  I have reviewed the triage vital signs and the nursing notes.   HISTORY  Chief Complaint Chest Pain    HPI Zachary Gordon is a 66 y.o. male with a history of hypertension, hyperlipidemia, CAD status post MI and stent placement 2011, presenting with chest pain, diaphoresis and shortness of breath.  The patient reports that he woke up this morning, made himself some coffee and was sitting watching TV when he had the acute onset of a left-sided "steady" chest pain associated with diaphoresis and shortness of breath.  He did not have any radiation, pleuritic pain, lightheadedness or syncope, palpitations.  His diaphoresis and shortness of breath have resolved but he continues to have a steady pain which is grossly unchanged.  He has not been having any chest pain over the last several weeks, and there is nothing that can make the chest pain better or worse, including exertion or food.  One month ago, he self discontinued his aspirin without speaking to his doctor.  He also reports that he had a routine stress test with his cardiologist last month which was reportedly normal.  Past Medical History:  Diagnosis Date  . Chronic kidney disease    stones  . Coronary artery disease    heart stent  . Gout   . Heart disease   . History of nephrectomy   . Hyperlipidemia   . Hypertension     There are no active problems to display for this patient.   Past Surgical History:  Procedure Laterality Date  . COLONOSCOPY WITH PROPOFOL N/A 01/10/2017   Procedure: COLONOSCOPY WITH PROPOFOL;  Surgeon: Toledo, Benay Pike, MD;  Location: [facility] ENDOSCOPY;  Service: Gastroenterology;  Laterality: N/A;  . CORONARY ANGIOPLASTY WITH STENT PLACEMENT    . NEPHRECTOMY Left     Current Outpatient Rx  . Order #: [id number] Class: Historical Med  . Order #:  [id number] Class: Historical Med  . Order #: [id number] Class: Historical Med  . Order #: [id number] Class: Historical Med  . Order #: [id number] Class: Historical Med  . Order #: [id number] Class: Historical Med    Allergies Penicillins  Family History  Problem Relation Age of Onset  . Prostate cancer Neg Hx   . Bladder Cancer Neg Hx   . Kidney cancer Neg Hx     Social History Social History   Tobacco Use  . Smoking status: Never Smoker  . Smokeless tobacco: Never Used  Substance Use Topics  . Alcohol use: No  . Drug use: No    Review of Systems Constitutional: No fever/chills.  Lightheadedness or syncope.  Positive diaphoresis. Eyes: No visual changes. ENT: No sore throat. No congestion or rhinorrhea. Cardiovascular: Positive chest pain. Denies palpitations. Respiratory: Positive shortness of breath.  No cough. Gastrointestinal: No abdominal pain.  No nausea, no vomiting.  No diarrhea.  No constipation. Genitourinary: Negative for dysuria. Musculoskeletal: Negative for back pain.  No lower extremity swelling.  No calf pain. Skin: Negative for rash. Neurological: Negative for headaches. No focal numbness, tingling or weakness.     ____________________________________________   PHYSICAL EXAM:  VITAL SIGNS: ED Triage Vitals  Enc Vitals Group     BP 01/23/17 1244 136/77     Pulse Rate 01/23/17 1244 69     Resp 01/23/17 1244 18     Temp 01/23/17 1244 97.9 F (36.6 C)     Temp Source 01/23/17 1244 Oral     SpO2  01/23/17 1244 100 %     Weight 01/23/17 1244 180 lb (81.6 kg)     Height 01/23/17 1244 5\' 10"  (1.778 m)     Head Circumference --      Peak Flow --      Pain Score 01/23/17 1301 6     Pain Loc --      Pain Edu? --      Excl. in [place]? --     Constitutional: Alert and oriented. Well appearing and in no acute distress. Answers questions appropriately. Eyes: Conjunctivae are normal.  EOMI. No scleral icterus. Head: Atraumatic. Nose: No  congestion/rhinnorhea. Mouth/Throat: Mucous membranes are moist.  Neck: No stridor.  Supple.  No JVD.  No meningismus. Cardiovascular: Normal rate, regular rhythm. No murmurs, rubs or gallops.  Respiratory: Normal respiratory effort.  No accessory muscle use or retractions. Lungs CTAB.  No wheezes, rales or ronchi. Gastrointestinal: Soft, nontender and nondistended.  No guarding or rebound.  No peritoneal signs. Musculoskeletal: No LE edema. No ttp in the calves or palpable cords.  Negative Homan's sign. Neurologic:  A&Ox3.  Speech is clear.  Face and smile are symmetric.  EOMI.  Moves all extremities well. Skin:  Skin is warm, dry and intact. No rash noted. Psychiatric: Mood and affect are normal. Speech and behavior are normal.  Normal judgement.  ____________________________________________   LABS (all labs ordered are listed, but only abnormal results are displayed)  Labs Reviewed  CBC - Abnormal; Notable for the following components:      Result Value   RBC 6.37 (*)    MCV 77.9 (*)    MCH 25.9 (*)    RDW 14.6 (*)    All other components within normal limits  COMPREHENSIVE METABOLIC PANEL - Abnormal; Notable for the following components:   Glucose, Bld 161 (*)    ALT 14 (*)    Total Bilirubin 1.6 (*)    All other components within normal limits  TROPONIN I - Abnormal; Notable for the following components:   Troponin I 0.03 (*)    All other components within normal limits  TROPONIN I - Abnormal; Notable for the following components:   Troponin I 0.06 (*)    All other components within normal limits  TROPONIN I   ____________________________________________  EKG  ED ECG REPORT I, Eula Listen, the attending physician, personally viewed and interpreted this ECG.   Date: 01/23/2017  EKG Time: 1242  Rate: 73  Rhythm: normal sinus rhythm  Axis: leftward  Intervals:none  ST&T Change: No STEMI  There are no prior EKGs for  comparison.  ____________________________________________  RADIOLOGY  Dg Chest 2 View  Result Date: 01/23/2017 CLINICAL DATA:  Left-sided chest pain and shortness of breath. EXAM: CHEST  2 VIEW COMPARISON:  None. FINDINGS: The cardiac silhouette is normal in size. The thoracic aorta is slightly tortuous with atherosclerotic calcification noted. The lungs are mildly hyperinflated. No airspace consolidation, edema, pleural effusion, or pneumothorax is identified. Upper abdominal surgical clips are noted related to prior left nephrectomy. Thoracic spondylosis is present. IMPRESSION: No active cardiopulmonary disease. Electronically Signed   By: Logan Bores M.D.   On: 01/23/2017 13:53    ____________________________________________   PROCEDURES  Procedure(s) performed: None  Procedures  Critical Care performed: Yes ____________________________________________   INITIAL IMPRESSION / ASSESSMENT AND PLAN / ED COURSE  Pertinent labs & imaging results that were available during my care of the patient were reviewed by me and considered in my medical decision making (see  chart for details).  66 y.o. male with a history of prior CVA status post stent, multiple cardiac risk factors, presenting with an episode of chest pain associated with diaphoresis and shortness of breath while at rest.  Overall, the patient is hemodynamically stable and has no concerning cardiopulmonary or neurologic findings on my examination.  His first troponin is negative.  ACS or MI is possible, but other possible etiologies including gastrointestinal causes such as esophageal spasm or reflux are possible.  Aortic pathology or PE are very unlikely.  Plan reevaluation for final disposition.  A second troponin is pending, and if it is negative, will speak to the patient's primary cardiologist, Dr. Chancy Milroy, for likely outpatient follow up.  ----------------------------------------- 5:05 PM on  01/23/2017 -----------------------------------------  At this time, the patient's pain has completely resolved after aspirin and sublingual nitroglycerin.  I will placed nitroglycerin on his chest to prevent further chest pain.  The patient's initial troponin was less than 0.03 and his repeat was 0.03.  I will plan to get one more troponin II hours from the last, for final disposition.  ----------------------------------------- 7:31 PM on 01/23/2017 -----------------------------------------  The patient's repeat troponin is 0.06.  I will admit him to the hospital for further evaluation and treatment.  At this time, the patient states that his chest pain has returned, despite his aspirin and nitro paste.  I will place him on a nitroglycerin drip, and he will be admitted.  ____________________________________________  FINAL CLINICAL IMPRESSION(S) / ED DIAGNOSES  Final diagnoses:  NSTEMI (non-ST elevated myocardial infarction) ([place])         NEW MEDICATIONS STARTED DURING THIS VISIT:  This SmartLink is deprecated. Use AVSMEDLIST instead to display the medication list for a patient.    Eula Listen, MD 01/23/17 1931    Eula Listen, MD 01/23/17 3500    Eula Listen, MD 02/08/17 2229

## 2017-01-23 NOTE — ED Notes (Signed)
Nitroglycerine gtt started. Pt verbalizes understanding of POC. He states after the 2 SL nitro he felt better but then increased again. Also the paste helps for a short period of time but now pain 4/10

## 2017-01-23 NOTE — H&P (Signed)
Zachary Gordon at [facility] NAME: Zachary Gordon    MR#:  [id number]  DATE OF BIRTH:  1950/05/31  DATE OF ADMISSION:  01/23/2017  PRIMARY CARE PHYSICIAN: Jodi Marble, MD   REQUESTING/REFERRING PHYSICIAN: Eula Listen MD  CHIEF COMPLAINT:   Chief Complaint  Patient presents with  . Chest Pain    HISTORY OF PRESENT ILLNESS: Zachary Gordon  is a 66 y.o. male with a known history of coronary artery disease and a previous stent who states that he stopped taking his aspirin 1 month ago because he felt like eating needed.  He was seen by his cardiologist 3 weeks ago for routine follow-up and he states that he had a cardiac stress test which she was told was normal.  He reports that he was doing well until this morning when he started having chest pains on the left side of his chest.  He describes it as a pressure-like sensation.  With sharp pains alternating.  There was no radiation of the pain to the neck or jaw.  He also had diaphoresis earlier today.  He has not had any nausea or vomiting. Patient symptoms continue to persist in the emergency room therefore he was started on a nitroglycerin drip.    PAST MEDICAL HISTORY:   Past Medical History:  Diagnosis Date  . Chronic kidney disease    stones  . Coronary artery disease    heart stent  . Gout   . Heart disease   . History of nephrectomy   . Hyperlipidemia   . Hypertension     PAST SURGICAL HISTORY:  Past Surgical History:  Procedure Laterality Date  . COLONOSCOPY WITH PROPOFOL N/A 01/10/2017   Procedure: COLONOSCOPY WITH PROPOFOL;  Surgeon: Toledo, Benay Pike, MD;  Location: [facility] ENDOSCOPY;  Service: Gastroenterology;  Laterality: N/A;  . CORONARY ANGIOPLASTY WITH STENT PLACEMENT    . NEPHRECTOMY Left     SOCIAL HISTORY:  Social History   Tobacco Use  . Smoking status: Never Smoker  . Smokeless tobacco: Never Used  Substance Use Topics  . Alcohol use: No    FAMILY  HISTORY:  Family History  Problem Relation Age of Onset  . Prostate cancer Neg Hx   . Bladder Cancer Neg Hx   . Kidney cancer Neg Hx     DRUG ALLERGIES:  Allergies  Allergen Reactions  . Penicillins Rash    Has patient had a PCN reaction causing immediate rash, facial/tongue/throat swelling, SOB or lightheadedness with hypotension: Yes Has patient had a PCN reaction causing severe rash involving mucus membranes or skin necrosis: No Has patient had a PCN reaction that required hospitalization: No Has patient had a PCN reaction occurring within the last 10 years: No If all of the above answers are "NO", then may proceed with Cephalosporin use.     REVIEW OF SYSTEMS:   CONSTITUTIONAL: No fever, fatigue or weakness.  EYES: No blurred or double vision.  EARS, NOSE, AND THROAT: No tinnitus or ear pain.  RESPIRATORY: No cough, shortness of breath, wheezing or hemoptysis.  CARDIOVASCULAR: Positive chest pain, orthopnea, edema.  GASTROINTESTINAL: No nausea, vomiting, diarrhea or abdominal pain.  GENITOURINARY: No dysuria, hematuria.  ENDOCRINE: No polyuria, nocturia,  HEMATOLOGY: No anemia, easy bruising or bleeding SKIN: No rash or lesion. MUSCULOSKELETAL: No joint pain or arthritis.   NEUROLOGIC: No tingling, numbness, weakness.  PSYCHIATRY: No anxiety or depression.   MEDICATIONS AT HOME:  Prior to Admission medications  Medication Sig Start Date End Date Taking? Authorizing Provider  amLODipine (NORVASC) 5 MG tablet  06/08/16  Yes [provider]  carvedilol (COREG) 6.25 MG tablet  06/08/16  Yes [provider]  Colchicine 0.6 MG CAPS Take 1 capsule by mouth daily. 11/21/16  Yes [provider]  lisinopril (PRINIVIL,ZESTRIL) 10 MG tablet  06/08/16  Yes [provider]  rosuvastatin (CRESTOR) 20 MG tablet  06/08/16  Yes [provider]      PHYSICAL EXAMINATION:   VITAL SIGNS: Blood pressure 124/87, pulse 71, temperature 98 F (36.7  C), temperature source Oral, resp. rate 15, height 5\' 10"  (1.778 m), weight 180 lb (81.6 kg), SpO2 99 %.  GENERAL:  66 y.o.-year-old patient lying in the bed with no acute distress.  EYES: Pupils equal, round, reactive to light and accommodation. No scleral icterus. Extraocular muscles intact.  HEENT: Head atraumatic, normocephalic. Oropharynx and nasopharynx clear.  NECK:  Supple, no jugular venous distention. No thyroid enlargement, no tenderness.  LUNGS: Normal breath sounds bilaterally, no wheezing, rales,rhonchi or crepitation. No use of accessory muscles of respiration.  CARDIOVASCULAR: S1, S2 normal. No murmurs, rubs, or gallops.  ABDOMEN: Soft, nontender, nondistended. Bowel sounds present. No organomegaly or mass.  EXTREMITIES: No pedal edema, cyanosis, or clubbing.  NEUROLOGIC: Cranial nerves II through XII are intact. Muscle strength 5/5 in all extremities. Sensation intact. Gait not checked.  PSYCHIATRIC: The patient is alert and oriented x 3.  SKIN: No obvious rash, lesion, or ulcer.   LABORATORY PANEL:   CBC Recent Labs  Lab 01/23/17 1251  WBC 6.1  HGB 16.5  HCT 49.6  PLT 218  MCV 77.9*  MCH 25.9*  MCHC 33.2  RDW 14.6*   ------------------------------------------------------------------------------------------------------------------  Chemistries  Recent Labs  Lab 01/23/17 1251  NA 138  K 4.8  CL 104  CO2 26  GLUCOSE 161*  BUN 14  CREATININE 1.02  CALCIUM 9.5  AST 22  ALT 14*  ALKPHOS 85  BILITOT 1.6*   ------------------------------------------------------------------------------------------------------------------ estimated creatinine clearance is 74.6 mL/min (by C-G formula based on SCr of 1.02 mg/dL). ------------------------------------------------------------------------------------------------------------------ No results for input(s): TSH, T4TOTAL, T3FREE, THYROIDAB in the last 72 hours.  Invalid input(s): FREET3   Coagulation profile No  results for input(s): INR, PROTIME in the last 168 hours. ------------------------------------------------------------------------------------------------------------------- No results for input(s): DDIMER in the last 72 hours. -------------------------------------------------------------------------------------------------------------------  Cardiac Enzymes Recent Labs  Lab 01/23/17 1251 01/23/17 1520 01/23/17 1728  TROPONINI <0.03 0.03* 0.06*   ------------------------------------------------------------------------------------------------------------------ Invalid input(s): POCBNP  ---------------------------------------------------------------------------------------------------------------  Urinalysis    Component Value Date/Time   APPEARANCEUR Clear 08/17/2016 1449   GLUCOSEU Negative 08/17/2016 1449   BILIRUBINUR Negative 08/17/2016 1449   PROTEINUR Trace (A) 08/17/2016 1449   NITRITE Negative 08/17/2016 1449   LEUKOCYTESUR Negative 08/17/2016 1449     RADIOLOGY: Dg Chest 2 View  Result Date: 01/23/2017 CLINICAL DATA:  Left-sided chest pain and shortness of breath. EXAM: CHEST  2 VIEW COMPARISON:  None. FINDINGS: The cardiac silhouette is normal in size. The thoracic aorta is slightly tortuous with atherosclerotic calcification noted. The lungs are mildly hyperinflated. No airspace consolidation, edema, pleural effusion, or pneumothorax is identified. Upper abdominal surgical clips are noted related to prior left nephrectomy. Thoracic spondylosis is present. IMPRESSION: No active cardiopulmonary disease. Electronically Signed   By: Logan Bores M.D.   On: 01/23/2017 13:53    EKG: Orders placed or performed during the hospital encounter of 01/23/17  . EKG 12-Lead  . EKG 12-Lead  . ED  EKG within 10 minutes  . ED EKG within 10 minutes    IMPRESSION AND PLAN: Patient is a 66 year old with history of coronary artery disease presenting with chest pain 1.  Acute non-ST  MI versus unstable angina I have discussed the case with his primary cardiologist Dr. Humphrey Rolls who recommends loading patient with Plavix He will see him in the morning and will perform a cardiac catheterization tomorrow We will continue patient on nitroglycerin drip for now Continue therapy with Coreg Aspirin 81 mg daily And higher dose statin Lipid panel in the a.m. Heparin drip per ACS protocol  2.  Essential hypertension I will hold amlodipine and lisinopril since he will be on a nitroglycerin drip continue Coreg  3.  Hyperlipidemia unspecified continue high-dose statin lipid panel in the a.m.  4.  Elevated blood sugar we will check a hemoglobin A1c  5.  Miscellaneous patient will be on full dose heparin     All the records are reviewed and case discussed with ED provider. Management plans discussed with the patient, family and they are in agreement.  CODE STATUS: Code Status History    This patient does not have a recorded code status. Please follow your organizational policy for patients in this situation.       TOTAL TIME TAKING CARE OF THIS PATIENT:55 minutes.    Dustin Flock M.D on 01/23/2017 at 9:10 PM  Between 7am to 6pm - Pager - [phone]  After 6pm go to [email or web address] - password [email or web address] [facility]  Office  [phone]  CC: Primary care physician; Jodi Marble, MD

## 2017-01-23 NOTE — ED Notes (Signed)
Date and time results received: 01/23/17 1610 (use smartphrase ".now" to insert current time)  Test: troponin Critical Value: 0.03  Name of Provider Notified: EDP  Orders Received? Or Actions Taken?: aware

## 2017-01-23 NOTE — ED Notes (Signed)
Call to give report, awaiting call back from RN

## 2017-01-23 NOTE — ED Triage Notes (Addendum)
Patient c/o chest pain that began this morning. Patient states he started to sweat and became SOB. Patient has a history of MI. Patient could not describe chest pain, but states it has been constant and on the left side. Patient also c/o left-side neck pain that has since resolved.Patient reports a history of cardiac stent in LAD.

## 2017-01-23 NOTE — Progress Notes (Signed)
ANTICOAGULATION CONSULT NOTE - Initial Consult  Pharmacy Consult for heparin Indication: chest pain/ACS  Allergies  Allergen Reactions  . Penicillins Rash    Has patient had a PCN reaction causing immediate rash, facial/tongue/throat swelling, SOB or lightheadedness with hypotension: Yes Has patient had a PCN reaction causing severe rash involving mucus membranes or skin necrosis: No Has patient had a PCN reaction that required hospitalization: No Has patient had a PCN reaction occurring within the last 10 years: No If all of the above answers are "NO", then may proceed with Cephalosporin use.     Patient Measurements: Height: 5\' 10"  (177.8 cm) Weight: 180 lb (81.6 kg) IBW/kg (Calculated) : 73 Heparin Dosing Weight: 81.6 kg  Vital Signs: Temp: 98 F (36.7 C) (12/03 2009) Temp Source: Oral (12/03 2009) BP: 124/87 (12/03 2030) Pulse Rate: 71 (12/03 2009)  Labs: Recent Labs    01/23/17 1251 01/23/17 1520 01/23/17 1728  HGB 16.5  --   --   HCT 49.6  --   --   PLT 218  --   --   CREATININE 1.02  --   --   TROPONINI <0.03 0.03* 0.06*    Estimated Creatinine Clearance: 74.6 mL/min (by C-G formula based on SCr of 1.02 mg/dL).   Medical History: Past Medical History:  Diagnosis Date  . Chronic kidney disease    stones  . Coronary artery disease    heart stent  . Gout   . Heart disease   . History of nephrectomy   . Hyperlipidemia   . Hypertension     Medications:  Scheduled:  . [START ON 01/24/2017] aspirin EC  81 mg Oral Daily  . [START ON 01/24/2017] aspirin EC  81 mg Oral Daily  . [START ON 01/24/2017] carvedilol  6.25 mg Oral BID WC  . [START ON 01/24/2017] finasteride  5 mg Oral Daily  . heparin  4,000 Units Intravenous Once  . [START ON 01/24/2017] rosuvastatin  40 mg Oral Daily    Assessment: Patient admitted for CP w/ h/o CAD s/p stent and was on ASA but stopped it a month ago claiming "he didn't fee like he needed it." Patient has trops of 0.06 EKG  not showing any obvious ST elevation. Being started on heparin drip  Goal of Therapy:  Heparin level 0.3-0.7 units/ml Monitor platelets by anticoagulation protocol: Yes   Plan:  Will bolus w/ heparin 4000 units IV x 1 Will start drip @ 950 units/hr  Will check an anti-Xa @ 0500 w/ am labs. Baseline labs drawn. Will monitor daily CBCs and will adjust per anti-Xa levels.  Tobie Lords, PharmD, BCPS Clinical Pharmacist 01/23/2017

## 2017-01-23 NOTE — ED Notes (Signed)
Pharmacy called regarding plavix, will send

## 2017-01-24 ENCOUNTER — Encounter
Admission: EM | Disposition: A | Discharge status: Home / Self Care | Payer: Self-pay | Source: Home / Self Care | Attending: Specialist

## 2017-01-24 HISTORY — PX: LEFT HEART CATH AND CORONARY ANGIOGRAPHY: CATH118249

## 2017-01-24 LAB — TROPONIN I
TROPONIN I: 0.18 ng/mL — AB (ref <0.03)
Troponin I: 0.06 ng/mL (ref <0.03)
Troponin I: 0.35 ng/mL (ref <0.03)
Troponin I: 0.48 ng/mL (ref <0.03)
Troponin I: 0.31 ng/mL (ref <0.03)

## 2017-01-24 LAB — BASIC METABOLIC PANEL
ANION GAP: 8 (ref 5–15)
ANION GAP: 12 (ref 5–15)
BUN: 19 mg/dL (ref 6–20)
BUN: 14 mg/dL (ref 6–20)
CALCIUM: 8.9 mg/dL (ref 8.9–10.3)
CO2: 22 mmol/L (ref 22–32)
CO2: 21 mmol/L — AB (ref 22–32)
CREATININE: 0.97 mg/dL (ref 0.61–1.24)
Calcium: 8.7 mg/dL — ABNORMAL LOW (ref 8.9–10.3)
Chloride: 107 mmol/L (ref 101–111)
Chloride: 105 mmol/L (ref 101–111)
Creatinine, Ser: 0.95 mg/dL (ref 0.61–1.24)
GFR calc Af Amer: >60 mL/min (ref >60)
GLUCOSE: 111 mg/dL — AB (ref 65–99)
Glucose, Bld: 112 mg/dL — ABNORMAL HIGH (ref 65–99)
Potassium: 3.6 mmol/L (ref 3.5–5.1)
Potassium: 4 mmol/L (ref 3.5–5.1)
Sodium: 137 mmol/L (ref 135–145)
Sodium: 138 mmol/L (ref 135–145)

## 2017-01-24 LAB — CBC
HCT: 50.1 % (ref 40.0–52.0)
HEMATOCRIT: 43.6 % (ref 40.0–52.0)
HEMOGLOBIN: 14.5 g/dL (ref 13.0–18.0)
HEMOGLOBIN: 16.7 g/dL (ref 13.0–18.0)
MCH: 25.8 pg — ABNORMAL LOW (ref 26.0–34.0)
MCH: 25.9 pg — ABNORMAL LOW (ref 26.0–34.0)
MCHC: 33.3 g/dL (ref 32.0–36.0)
MCHC: 33.3 g/dL (ref 32.0–36.0)
MCV: 77.6 fL — ABNORMAL LOW (ref 80.0–100.0)
MCV: 77.8 fL — ABNORMAL LOW (ref 80.0–100.0)
Platelets: 191 10*3/uL (ref 150–440)
Platelets: 193 10*3/uL (ref 150–440)
RBC: 5.62 MIL/uL (ref 4.40–5.90)
RBC: 6.44 MIL/uL — AB (ref 4.40–5.90)
RDW: 14.9 % — ABNORMAL HIGH (ref 11.5–14.5)
RDW: 14.5 % (ref 11.5–14.5)
WBC: 7.2 10*3/uL (ref 3.8–10.6)
WBC: 7.5 10*3/uL (ref 3.8–10.6)

## 2017-01-24 LAB — PROTIME-INR
INR: 1.2
Prothrombin Time: 15.1 seconds (ref 11.4–15.2)

## 2017-01-24 LAB — LIPID PANEL
CHOL/HDL RATIO: 4.4 ratio
Cholesterol: 163 mg/dL (ref 0–200)
HDL: 37 mg/dL — ABNORMAL LOW (ref >40)
LDL CALC: 94 mg/dL (ref 0–99)
TRIGLYCERIDES: 162 mg/dL — AB (ref <150)
VLDL: 32 mg/dL (ref 0–40)

## 2017-01-24 LAB — HEMOGLOBIN A1C
HEMOGLOBIN A1C: 6.3 % — AB (ref 4.8–5.6)
Mean Plasma Glucose: 134.11 mg/dL

## 2017-01-24 LAB — HEPARIN LEVEL (UNFRACTIONATED)
HEPARIN UNFRACTIONATED: 0.33 [IU]/mL (ref 0.30–0.70)
Heparin Unfractionated: 0.34 IU/mL (ref 0.30–0.70)

## 2017-01-24 LAB — APTT: aPTT: 128 seconds — ABNORMAL HIGH (ref 24–36)

## 2017-01-24 SURGERY — LEFT HEART CATH AND CORONARY ANGIOGRAPHY
Anesthesia: Moderate Sedation | Laterality: Right

## 2017-01-24 MED ORDER — HEPARIN (PORCINE) IN NACL 2-0.9 UNIT/ML-% IJ SOLN
INTRAMUSCULAR | Status: AC
  Filled 2017-01-24: qty 500

## 2017-01-24 MED ORDER — SODIUM CHLORIDE 0.9% FLUSH: 3.0000 mL | INTRAVENOUS | Status: DC | PRN

## 2017-01-24 MED ORDER — SODIUM CHLORIDE 0.9% FLUSH: 3.0000 mL | Freq: Two times a day (BID) | INTRAVENOUS | Status: DC

## 2017-01-24 MED ORDER — ASPIRIN 81 MG PO CHEW: 81.0000 mg | CHEWABLE_TABLET | ORAL | Status: DC

## 2017-01-24 MED ORDER — SODIUM CHLORIDE 0.9 % WEIGHT BASED INFUSION: 1.0000 mL/kg/h | INTRAVENOUS | Status: DC

## 2017-01-24 MED ORDER — INFLUENZA VAC SPLIT HIGH-DOSE 0.5 ML IM SUSY
0.5000 mL | PREFILLED_SYRINGE | INTRAMUSCULAR | Status: AC
  Administered 2017-01-24: 0.5 mL via INTRAMUSCULAR
  Filled 2017-01-24: qty 0.5

## 2017-01-24 MED ORDER — IOPAMIDOL (ISOVUE-300) INJECTION 61%
INTRAVENOUS | Status: DC | PRN
  Administered 2017-01-24: 60 mL via INTRA_ARTERIAL

## 2017-01-24 MED ORDER — INFLUENZA VAC SPLIT HIGH-DOSE 0.5 ML IM SUSY: 0.5000 mL | PREFILLED_SYRINGE | INTRAMUSCULAR | Status: DC

## 2017-01-24 MED ORDER — SODIUM CHLORIDE 0.9 % IV SOLN: 250.0000 mL | INTRAVENOUS | Status: DC | PRN

## 2017-01-24 MED ORDER — ONDANSETRON HCL 4 MG/2ML IJ SOLN: 4.0000 mg | Freq: Four times a day (QID) | INTRAMUSCULAR | Status: DC | PRN

## 2017-01-24 MED ORDER — SODIUM CHLORIDE 0.9 % WEIGHT BASED INFUSION
1.0000 mL/kg/h | INTRAVENOUS | Status: DC
  Administered 2017-01-24: 1 mL/kg/h via INTRAVENOUS

## 2017-01-24 MED ORDER — FENTANYL CITRATE (PF) 100 MCG/2ML IJ SOLN
INTRAMUSCULAR | Status: AC
  Filled 2017-01-24: qty 2

## 2017-01-24 MED ORDER — FENTANYL CITRATE (PF) 100 MCG/2ML IJ SOLN
INTRAMUSCULAR | Status: DC | PRN
  Administered 2017-01-24: 50 ug via INTRAVENOUS

## 2017-01-24 MED ORDER — LIDOCAINE HCL (PF) 1 % IJ SOLN
INTRAMUSCULAR | Status: AC
  Filled 2017-01-24: qty 30

## 2017-01-24 MED ORDER — SODIUM CHLORIDE 0.9 % WEIGHT BASED INFUSION: 3.0000 mL/kg/h | INTRAVENOUS | Status: DC

## 2017-01-24 MED ORDER — ASPIRIN EC 81 MG PO TBEC: 81.0000 mg | DELAYED_RELEASE_TABLET | Freq: Every day | ORAL | 1 refills | Status: AC

## 2017-01-24 MED ORDER — ACETAMINOPHEN 325 MG PO TABS: 650.0000 mg | ORAL_TABLET | ORAL | Status: DC | PRN

## 2017-01-24 MED ORDER — MIDAZOLAM HCL 2 MG/2ML IJ SOLN
INTRAMUSCULAR | Status: AC
  Filled 2017-01-24: qty 2

## 2017-01-24 MED ORDER — CLOPIDOGREL BISULFATE 75 MG PO TABS: 75.0000 mg | ORAL_TABLET | Freq: Every day | ORAL | 1 refills | Status: DC

## 2017-01-24 MED ORDER — HEPARIN (PORCINE) IN NACL 2-0.9 UNIT/ML-% IJ SOLN
INTRAMUSCULAR | Status: AC
  Filled 2017-01-24: qty 1000

## 2017-01-24 MED ORDER — MIDAZOLAM HCL 2 MG/2ML IJ SOLN
INTRAMUSCULAR | Status: DC | PRN
  Administered 2017-01-24: 1 mg via INTRAVENOUS

## 2017-01-24 SURGICAL SUPPLY — 9 items
CATH INFINITI 5FR ANG PIGTAIL (CATHETERS) ×3 IMPLANT
CATH INFINITI 5FR JL4 (CATHETERS) ×3 IMPLANT
CATH INFINITI JR4 5F (CATHETERS) ×3 IMPLANT
DEVICE CLOSURE MYNXGRIP 5F (Vascular Products) ×3 IMPLANT
KIT MANI 3VAL PERCEP (MISCELLANEOUS) ×3 IMPLANT
NEEDLE PERC 18GX7CM (NEEDLE) ×3 IMPLANT
PACK CARDIAC CATH (CUSTOM PROCEDURE TRAY) ×3 IMPLANT
SHEATH PINNACLE 5F 10CM (SHEATH) ×3 IMPLANT
WIRE EMERALD 3MM-J .035X150CM (WIRE) ×3 IMPLANT

## 2017-01-24 NOTE — Progress Notes (Signed)
Patient is NPO for cardiac catheterization,heparin drip infusing,denies cheat pain.

## 2017-01-24 NOTE — Consult Note (Signed)
Acute coronary syndrome with elevated troponin with peak troponin 0.48 with associated severe chest pain subsided with IV nitroglycerin. Advise cardiac catheterization which will be set up for today. Patient was explained risks and benefits and patient has agreed to the procedure.

## 2017-01-24 NOTE — Progress Notes (Signed)
Notified MD of elevated TIS. Orders placed.Will continue to monitor and assess.

## 2017-01-24 NOTE — Progress Notes (Signed)
Nitro drip stopped,BP 92/49

## 2017-01-24 NOTE — Progress Notes (Signed)
Stent in LAD fine, may go home today or tomorrow on asp81 and plavix 75 mg dilay and f/u next Tuesday 10am.

## 2017-01-24 NOTE — Progress Notes (Signed)
Pt arrived via stretcher from ED. Pt A&O with wife at bedside. Ntg infusing @ 69mcg. Telemetry monitor and applied and called to CCMD. Oriented to room and call bell system and ascom. Yellow socks placed and bed alarm placed, discussed and educated on the reason.

## 2017-01-24 NOTE — Progress Notes (Signed)
Went over discharge instructions including medications and follow-up appointments. Discontinue peripheral IV and telemetry monitor. RN will transport patient.

## 2017-01-24 NOTE — Progress Notes (Signed)
[place] [facility] [place] NAME: Zachary Gordon    MR#:  [id number]  DATE OF BIRTH:  1950/09/16  SUBJECTIVE:   Patient here due to chest pain and noted to have a mildly elevated troponin. Currently chest pain-free. Patient had a outpatient stress test done 3 weeks ago which was negative and now presents with worsening chest pain. Seen by cardiology and plan for cardiac catheterization later today.  REVIEW OF SYSTEMS:    Review of Systems  Constitutional: Negative for chills and fever.  HENT: Negative for congestion and tinnitus.   Eyes: Negative for blurred vision and double vision.  Respiratory: Negative for cough, shortness of breath and wheezing.   Cardiovascular: Negative for chest pain, orthopnea and PND.  Gastrointestinal: Negative for abdominal pain, diarrhea, nausea and vomiting.  Genitourinary: Negative for dysuria and hematuria.  Neurological: Negative for dizziness, sensory change and focal weakness.  All other systems reviewed and are negative.   Nutrition: Heart Healthy Tolerating Diet: Yes Tolerating PT: Ambulatory   DRUG ALLERGIES:   Allergies  Allergen Reactions  . Penicillins Rash    Has patient had a PCN reaction causing immediate rash, facial/tongue/throat swelling, SOB or lightheadedness with hypotension: Yes Has patient had a PCN reaction causing severe rash involving mucus membranes or skin necrosis: No Has patient had a PCN reaction that required hospitalization: No Has patient had a PCN reaction occurring within the last 10 years: No If all of the above answers are "NO", then may proceed with Cephalosporin use.     VITALS:  Blood pressure 135/77, pulse 81, temperature 98.5 F (36.9 C), temperature source Oral, resp. rate 16, height 5\' 10"  (1.778 m), weight 81.6 kg (180 lb), SpO2 99 %.  PHYSICAL EXAMINATION:   Physical Exam  GENERAL:  66 y.o.-year-old patient lying in bed in no acute distress.  EYES: Pupils  equal, round, reactive to light and accommodation. No scleral icterus. Extraocular muscles intact.  HEENT: Head atraumatic, normocephalic. Oropharynx and nasopharynx clear.  NECK:  Supple, no jugular venous distention. No thyroid enlargement, no tenderness.  LUNGS: Normal breath sounds bilaterally, no wheezing, rales, rhonchi. No use of accessory muscles of respiration.  CARDIOVASCULAR: S1, S2 normal. No murmurs, rubs, or gallops.  ABDOMEN: Soft, nontender, nondistended. Bowel sounds present. No organomegaly or mass.  EXTREMITIES: No cyanosis, clubbing or edema b/l.    NEUROLOGIC: Cranial nerves II through XII are intact. No focal Motor or sensory deficits b/l.   PSYCHIATRIC: The patient is alert and oriented x 3.  SKIN: No obvious rash, lesion, or ulcer.    LABORATORY PANEL:   CBC Recent Labs  Lab 01/24/17 0501  WBC 7.2  HGB 14.5  HCT 43.6  PLT 191   ------------------------------------------------------------------------------------------------------------------  Chemistries  Recent Labs  Lab 01/23/17 1251 01/24/17 0501  NA 138 137  K 4.8 3.6  CL 104 107  CO2 26 22  GLUCOSE 161* 111*  BUN 14 19  CREATININE 1.02 0.97  CALCIUM 9.5 8.7*  AST 22  --   ALT 14*  --   ALKPHOS 85  --   BILITOT 1.6*  --    ------------------------------------------------------------------------------------------------------------------  Cardiac Enzymes Recent Labs  Lab 01/24/17 1051  TROPONINI 0.31*   ------------------------------------------------------------------------------------------------------------------  RADIOLOGY:  Dg Chest 2 View  Result Date: 01/23/2017 CLINICAL DATA:  Left-sided chest pain and shortness of breath. EXAM: CHEST  2 VIEW COMPARISON:  None. FINDINGS: The cardiac silhouette is normal in size. The thoracic aorta is slightly tortuous with  atherosclerotic calcification noted. The lungs are mildly hyperinflated. No airspace consolidation, edema, pleural effusion,  or pneumothorax is identified. Upper abdominal surgical clips are noted related to prior left nephrectomy. Thoracic spondylosis is present. IMPRESSION: No active cardiopulmonary disease. Electronically Signed   By: Logan Bores M.D.   On: 01/23/2017 13:53     ASSESSMENT AND PLAN:   66 year old male with past medical history of coronary artery disease status post stent placement, gout, history of hypertension, hyperlipidemia who presents to the hospital with chest pain and noted to have a mildly elevated troponin.  1. Non-ST elevation MI-patient presented to the hospital with chest pain and mildly elevated troponin. Troponins peaked as high as 0.3. Patient has no acute EKG changes. He does have a previous cardiac history with stent placement in 2011. - Seen by cardiology and plan for cardiac catheterization today. -cont. ASA, Heparin gtt, Coreg, Nitro, Crestor  2. HTN - cont. Coreg,   3. Hyperlipidemia - cont. Crestor.   4. BPH - cont. Flomax.      All the records are reviewed and case discussed with Care Management/Social Worker. Management plans discussed with the patient, family and they are in agreement.  CODE STATUS: Full code  DVT Prophylaxis: Heparin gtt  TOTAL TIME TAKING CARE OF THIS PATIENT: 30 minutes.   POSSIBLE D/C IN 1-2 DAYS, DEPENDING ON CLINICAL CONDITION.   Henreitta Leber M.D on 01/24/2017 at 2:52 PM  Between 7am to 6pm - Pager - [phone]  After 6pm go to [email or web address] - Proofreader  [facility]  Office  [phone]  CC: Primary care physician; Jodi Marble, MD

## 2017-01-24 NOTE — Progress Notes (Signed)
Patient at bedside, waiting for MD to reconcile discharge order. Right femoral site soft and non-tender, at level 0 patient's wife will be off at 8 pm, will wait for the wife to come to go home. No other concern at the moment. RN will continue to monitor.

## 2017-01-24 NOTE — Progress Notes (Signed)
ANTICOAGULATION CONSULT NOTE - Initial Consult  Pharmacy Consult for heparin Indication: chest pain/ACS  Allergies  Allergen Reactions  . Penicillins Rash    Has patient had a PCN reaction causing immediate rash, facial/tongue/throat swelling, SOB or lightheadedness with hypotension: Yes Has patient had a PCN reaction causing severe rash involving mucus membranes or skin necrosis: No Has patient had a PCN reaction that required hospitalization: No Has patient had a PCN reaction occurring within the last 10 years: No If all of the above answers are "NO", then may proceed with Cephalosporin use.     Patient Measurements: Height: 5\' 10"  (177.8 cm) Weight: 180 lb 14.4 oz (82.1 kg) IBW/kg (Calculated) : 73 Heparin Dosing Weight: 81.6 kg  Vital Signs: Temp: 98.3 F (36.8 C) (12/04 0402) Temp Source: Oral (12/03 2228) BP: 101/61 (12/04 0625) Pulse Rate: 65 (12/04 0625)  Labs: Recent Labs    01/23/17 1251  01/23/17 1728 01/23/17 2323 01/24/17 0501  HGB 16.5  --   --   --  14.5  HCT 49.6  --   --   --  43.6  PLT 218  --   --   --  191  APTT  --   --   --  128*  --   LABPROT  --   --   --  15.1  --   INR  --   --   --  1.20  --   HEPARINUNFRC  --   --   --   --  0.34  CREATININE 1.02  --   --   --  0.97  TROPONINI <0.03   < > 0.06* 0.35* 0.48*   < > = values in this interval not displayed.    Estimated Creatinine Clearance: 78.4 mL/min (by C-G formula based on SCr of 0.97 mg/dL).   Medical History: Past Medical History:  Diagnosis Date  . Chronic kidney disease    stones  . Coronary artery disease    heart stent  . Gout   . Heart disease   . History of nephrectomy   . Hyperlipidemia   . Hypertension     Medications:  Scheduled:  . aspirin EC  81 mg Oral Daily  . carvedilol  6.25 mg Oral BID WC  . finasteride  5 mg Oral Daily  . [START ON 01/25/2017] Influenza vac split quadrivalent PF  0.5 mL Intramuscular Tomorrow-1000  . rosuvastatin  40 mg Oral Daily     Assessment: Patient admitted for CP w/ h/o CAD s/p stent and was on ASA but stopped it a month ago claiming "he didn't fee like he needed it." Patient has trops of 0.06 EKG not showing any obvious ST elevation. Being started on heparin drip  Goal of Therapy:  Heparin level 0.3-0.7 units/ml Monitor platelets by anticoagulation protocol: Yes   Plan:  Will bolus w/ heparin 4000 units IV x 1 Will start drip @ 950 units/hr  Will check an anti-Xa @ 0500 w/ am labs. Baseline labs drawn. Will monitor daily CBCs and will adjust per anti-Xa levels.  12/4 @ 0500 HL 0.34 therapeutic. Will continue current rate and will recheck HL @ 1100.  Tobie Lords, PharmD, BCPS Clinical Pharmacist 01/24/2017

## 2017-01-24 NOTE — Progress Notes (Signed)
ANTICOAGULATION CONSULT NOTE - Initial Consult  Pharmacy Consult for heparin Indication: chest pain/ACS  Allergies  Allergen Reactions  . Penicillins Rash    Has patient had a PCN reaction causing immediate rash, facial/tongue/throat swelling, SOB or lightheadedness with hypotension: Yes Has patient had a PCN reaction causing severe rash involving mucus membranes or skin necrosis: No Has patient had a PCN reaction that required hospitalization: No Has patient had a PCN reaction occurring within the last 10 years: No If all of the above answers are "NO", then may proceed with Cephalosporin use.     Patient Measurements: Height: 5\' 10"  (177.8 cm) Weight: 180 lb 14.4 oz (82.1 kg) IBW/kg (Calculated) : 73 Heparin Dosing Weight: 82 kg  Vital Signs: Temp: 97.8 F (36.6 C) (12/04 0801) Temp Source: Oral (12/04 0801) BP: 92/49 (12/04 0801) Pulse Rate: 74 (12/04 0801)  Labs: Recent Labs    01/23/17 1251  01/23/17 1728 01/23/17 2323 01/24/17 0501 01/24/17 1051  HGB 16.5  --   --   --  14.5  --   HCT 49.6  --   --   --  43.6  --   PLT 218  --   --   --  191  --   APTT  --   --   --  128*  --   --   LABPROT  --   --   --  15.1  --   --   INR  --   --   --  1.20  --   --   HEPARINUNFRC  --   --   --   --  0.34 0.33  CREATININE 1.02  --   --   --  0.97  --   TROPONINI <0.03   < > 0.06* 0.35* 0.48*  --    < > = values in this interval not displayed.    Estimated Creatinine Clearance: 78.4 mL/min (by C-G formula based on SCr of 0.97 mg/dL).  Assessment: Pharmacy consulted to dose and monitor heparin drip in this 66 year old male for ACS.   Goal of Therapy:  Heparin level 0.3-0.7 units/ml Monitor platelets by anticoagulation protocol: Yes   Plan:  Confirmatory HL = 0.33 is therapeutic. Continue heparin infusion at current rate of 950 units/hr. Will order HL and CBC with AM labs tomorrow per protocol.  Lenis Noon, PharmD, BCPS 01/24/17 11:40 AM

## 2017-01-25 ENCOUNTER — Encounter: Payer: Self-pay | Admitting: Cardiovascular Disease

## 2017-01-25 NOTE — Discharge Summary (Signed)
[facility] at [place] NAME: Zachary Gordon    MR#:  [id number]  DATE OF BIRTH:  12-07-1950  DATE OF ADMISSION:  01/23/2017 ADMITTING PHYSICIAN: Dustin Flock, MD  DATE OF DISCHARGE: 01/24/2017  9:00 PM  PRIMARY CARE PHYSICIAN: Jodi Marble, MD    ADMISSION DIAGNOSIS:  NSTEMI (non-ST elevated myocardial infarction) ([place]) [I21.4]  DISCHARGE DIAGNOSIS:  Active Problems:   NSTEMI (non-ST elevated myocardial infarction) ([place])   SECONDARY DIAGNOSIS:   Past Medical History:  Diagnosis Date  . Chronic kidney disease    stones  . Coronary artery disease    heart stent  . Gout   . Heart disease   . History of nephrectomy   . Hyperlipidemia   . Hypertension     HOSPITAL COURSE:   66 year old male with past medical history of coronary artery disease status post stent placement, gout, history of hypertension, hyperlipidemia who presents to the hospital with chest pain and noted to have a mildly elevated troponin.  1. Non-ST elevation MI-patient presented to the hospital with chest pain and mildly elevated troponin. Troponins peaked as high as 0.3. Patient had no acute EKG changes. He does have a previous cardiac history with stent placement in 2011. -Initially admitted to the hospital started on aspirin, heparin drip and maintained on his Coreg, Crestor, nitroglycerin as needed. Seen by cardiology and underwent a cardiac catheterization which showed patent stent with no significant coronary artery disease that needed intervention. -Cardiology recommended discharge on dual antiplatelet therapy with aspirin, Plavix and maintenance of his other cardiac meds and follow-up with them in a week.  2. HTN - patient will continue Coreg, lisinopril  3. Hyperlipidemia - pt. Will cont. Crestor.   4. BPH - pt. will cont. Flomax.     DISCHARGE CONDITIONS:   Stable  CONSULTS OBTAINED:  Treatment Team:  Dionisio David, MD  DRUG ALLERGIES:    Allergies  Allergen Reactions  . Penicillins Rash    Has patient had a PCN reaction causing immediate rash, facial/tongue/throat swelling, SOB or lightheadedness with hypotension: Yes Has patient had a PCN reaction causing severe rash involving mucus membranes or skin necrosis: No Has patient had a PCN reaction that required hospitalization: No Has patient had a PCN reaction occurring within the last 10 years: No If all of the above answers are "NO", then may proceed with Cephalosporin use.     DISCHARGE MEDICATIONS:   Allergies as of 01/24/2017      Reactions   Penicillins Rash   Has patient had a PCN reaction causing immediate rash, facial/tongue/throat swelling, SOB or lightheadedness with hypotension: Yes Has patient had a PCN reaction causing severe rash involving mucus membranes or skin necrosis: No Has patient had a PCN reaction that required hospitalization: No Has patient had a PCN reaction occurring within the last 10 years: No If all of the above answers are "NO", then may proceed with Cephalosporin use.      Medication List    TAKE these medications   amLODipine 5 MG tablet Commonly known as:  NORVASC   aspirin EC 81 MG tablet Take 1 tablet (81 mg total) by mouth daily.   carvedilol 6.25 MG tablet Commonly known as:  COREG   clopidogrel 75 MG tablet Commonly known as:  PLAVIX Take 1 tablet (75 mg total) by mouth daily.   Colchicine 0.6 MG Caps Take 1 capsule by mouth daily.   lisinopril 10 MG tablet Commonly known as:  PRINIVIL,ZESTRIL   rosuvastatin 20 MG tablet Commonly known as:  CRESTOR         DISCHARGE INSTRUCTIONS:   DIET:  Cardiac diet  DISCHARGE CONDITION:  Stable  ACTIVITY:  Activity as tolerated  OXYGEN:  Home Oxygen: No.   Oxygen Delivery: room air  DISCHARGE LOCATION:  home   If you experience worsening of your admission symptoms, develop shortness of breath, life threatening emergency, suicidal or homicidal thoughts  you must seek medical attention immediately by calling 911 or calling your MD immediately  if symptoms less severe.  You Must read complete instructions/literature along with all the possible adverse reactions/side effects for all the Medicines you take and that have been prescribed to you. Take any new Medicines after you have completely understood and accpet all the possible adverse reactions/side effects.   Please note  You were cared for by a hospitalist during your hospital stay. If you have any questions about your discharge medications or the care you received while you were in the hospital after you are discharged, you can call the unit and asked to speak with the hospitalist on call if the hospitalist that took care of you is not available. Once you are discharged, your primary care physician will handle any further medical issues. Please note that NO REFILLS for any discharge medications will be authorized once you are discharged, as it is imperative that you return to your primary care physician (or establish a relationship with a primary care physician if you do not have one) for your aftercare needs so that they can reassess your need for medications and monitor your lab values.   DATA REVIEW:   CBC Recent Labs  Lab 01/24/17 1658  WBC 7.5  HGB 16.7  HCT 50.1  PLT 193    Chemistries  Recent Labs  Lab 01/23/17 1251  01/24/17 1658  NA 138   < > 138  K 4.8   < > 4.0  CL 104   < > 105  CO2 26   < > 21*  GLUCOSE 161*   < > 112*  BUN 14   < > 14  CREATININE 1.02   < > 0.95  CALCIUM 9.5   < > 8.9  AST 22  --   --   ALT 14*  --   --   ALKPHOS 85  --   --   BILITOT 1.6*  --   --    < > = values in this interval not displayed.    Cardiac Enzymes Recent Labs  Lab 01/24/17 1658  TROPONINI 0.18*    Microbiology Results  Results for orders placed or performed in visit on 08/17/16  Microscopic Examination     Status: Abnormal   Collection Time: 08/17/16  2:49 PM   Result Value Ref Range Status   WBC, UA 0-5 0 - 5 /hpf Final   RBC, UA 3-10 (A) 0 - 2 /hpf Final   Epithelial Cells (non renal) 0-10 0 - 10 /hpf Final   Crystals Present (A) N/A Final   Crystal Type Calcium Oxalate N/A Final   Mucus, UA Present (A) Not Estab. Final   Bacteria, UA Few (A) None seen/Few Final    RADIOLOGY:  No results found.    Management plans discussed with the patient, family and they are in agreement.  CODE STATUS:  Code Status History    Date Active Date Inactive Code Status Order ID Comments User Context   01/23/2017 22:06 01/25/2017  00:04 Full Code [id number]  Dustin Flock, MD Inpatient      TOTAL TIME TAKING CARE OF THIS PATIENT: 40 minutes.    Henreitta Leber M.D on 01/25/2017 at 3:51 PM  Between 7am to 6pm - Pager - [phone]  After 6pm go to [email or web address] - Proofreader  [facility]  Office  [phone]  CC: Primary care physician; Jodi Marble, MD

## 2017-01-31 DIAGNOSIS — E783 Hyperchylomicronemia: Secondary | ICD-10-CM | POA: Diagnosis not present

## 2017-01-31 DIAGNOSIS — I10 Essential (primary) hypertension: Secondary | ICD-10-CM | POA: Diagnosis not present

## 2017-01-31 DIAGNOSIS — R0602 Shortness of breath: Secondary | ICD-10-CM | POA: Diagnosis not present

## 2017-01-31 DIAGNOSIS — R079 Chest pain, unspecified: Secondary | ICD-10-CM | POA: Diagnosis not present

## 2017-01-31 DIAGNOSIS — I2510 Atherosclerotic heart disease of native coronary artery without angina pectoris: Secondary | ICD-10-CM | POA: Diagnosis not present

## 2017-01-31 DIAGNOSIS — Z9861 Coronary angioplasty status: Secondary | ICD-10-CM | POA: Diagnosis not present

## 2017-02-02 DIAGNOSIS — M109 Gout, unspecified: Secondary | ICD-10-CM | POA: Diagnosis not present

## 2017-02-02 DIAGNOSIS — R010 Benign and innocent cardiac murmurs: Secondary | ICD-10-CM | POA: Diagnosis not present

## 2017-02-02 DIAGNOSIS — I10 Essential (primary) hypertension: Secondary | ICD-10-CM | POA: Diagnosis not present

## 2017-02-02 DIAGNOSIS — I2510 Atherosclerotic heart disease of native coronary artery without angina pectoris: Secondary | ICD-10-CM | POA: Diagnosis not present

## 2017-02-06 ENCOUNTER — Ambulatory Visit
Admission: RE | Admit: 2017-02-06 | Discharge: 2017-02-06 | Disposition: A | Discharge status: Home / Self Care | Payer: PPO | Source: Ambulatory Visit | Attending: Urology | Admitting: Urology

## 2017-02-06 DIAGNOSIS — Z905 Acquired absence of kidney: Secondary | ICD-10-CM | POA: Diagnosis not present

## 2017-02-06 DIAGNOSIS — N200 Calculus of kidney: Secondary | ICD-10-CM | POA: Diagnosis not present

## 2017-02-07 DIAGNOSIS — H26491 Other secondary cataract, right eye: Secondary | ICD-10-CM | POA: Diagnosis not present

## 2017-02-13 ENCOUNTER — Other Ambulatory Visit: Payer: Self-pay

## 2017-02-13 DIAGNOSIS — N400 Enlarged prostate without lower urinary tract symptoms: Secondary | ICD-10-CM | POA: Diagnosis not present

## 2017-02-16 ENCOUNTER — Ambulatory Visit: Payer: PPO | Admitting: Urology

## 2017-02-16 ENCOUNTER — Encounter: Payer: Self-pay | Admitting: Urology

## 2017-02-16 VITALS — BP 163/73 | HR 71 | Ht 70.0 in | Wt 183.8 lb

## 2017-02-16 DIAGNOSIS — N469 Male infertility, unspecified: Secondary | ICD-10-CM | POA: Diagnosis not present

## 2017-02-16 DIAGNOSIS — N400 Enlarged prostate without lower urinary tract symptoms: Secondary | ICD-10-CM

## 2017-02-16 DIAGNOSIS — N200 Calculus of kidney: Secondary | ICD-10-CM

## 2017-02-16 LAB — SEMEN ANALYSIS, BASIC
519106: 49 % (ref >31)
AGGLUTINATION: NONE SEEN
Appearance: NORMAL
Bacteria: NONE SEEN /hpf
CONCENTRATION, SPERM: 21.8 x10E6/mL (ref >14.9)
Debris: NONE SEEN /hpf
EPITHELIAL CELLS: 5 /HPF
IMMATURE GERM CELL CONC.: 0.4 x10E6/mL
Immotile Sperm: 46 %
LEUKOCYTE CONCENTRATION: 0.4 x10E6/mL (ref <1.0)
NONPROGRESSIVE SPERM: 0.7 x10E6
NORMAL MORPHOLOGY-STRICT: 16 % (ref >3)
Non-Progressive (NP): 5 %
Non-specific Aggregation: NONE SEEN
PH: 8 (ref >7.1)
PROGRESSIVELY MOTILE SPERM: 6.5 x10E6
ROUND CELL CONCENTRATION: 0.8 x10E6/mL
TIME COLLECTED: 1322
TOTAL SPERM IN EJACULATE: 13.1 x10E6 — AB (ref >38.9)
Time Received: 1340
Time Since Last Emission: 72 hours
Total Motile Sperm: 7.1 x10E6
Total Motility (PR+NP): 54 % (ref >39)
Trichomonas: NONE SEEN /hpf
Volume: 0.6 mL — ABNORMAL LOW (ref >1.4)

## 2017-02-16 NOTE — Progress Notes (Signed)
02/16/2017 2:52 PM   Zachary Gordon 03-Aug-1950 [id number]  Referring provider: Lavera Guise, [place], [place] [address]  Chief Complaint  Patient presents with  . Follow-up    renal US results    HPI: The patient is a 66 year old gentle man with a past medical history of left nephrectomy and BPH on finasteride presents today for follow up.   1. BPH The patient notes a weak stream, urinary frequency, nocturia, intermittency, and straining to urinate. He has been on finasteride until 7 months ago when he stopped it as he thought it may be impairing his ability to have a child with his wife. He has never tried Flomax before. He does feel this bothers his quality of life overall.  His urinary symptoms that have improved surprisingly since stopping finasteride.  He remains uninterested in medications that could affect his fertility as described below  Recent renal ultrasound was positive for intravesical lobe of the prostate.  2. Infertility The patient has been trying to conceive a child with his wife for 7 years. She has been worked up and was told that she is normal. She is 65 years of age. He has had a semen analysis in the past and was told that he had reduced sperm counts. They have tried in vitro fertilization 3 without success. He was on finasteride until approximately  seven months ago when he stopped his he thought it is impairing his chances of fertility. He has noticed that his semen has started to get thicker and less clear since stopping this medication.  He submitted a semen analysis 3 days ago which is not available currently.  3. Nephrolithiasis This had multiple surgeries including open pyelolithotomy resulting loss of left kidney. His most recent stone episodes was in 2010 which was treated with an attempt at right ureteroscopy that failed because it "could not be reached"  followed by lithotripsy.  Previous stone was calcium oxalate.  Renal ultrasound  shows a 4 mm nonobstructing right lower pole calculus.    4. History of left nephrectomy His left kidney was removed as result of an iatrogenic injury during stone surgery many years ago. It was not removed for malignant reasons.  5. Prostate cancer screening PSA was 0.6 in May 2018.       PMH: Past Medical History:  Diagnosis Date  . Chronic kidney disease    stones  . Coronary artery disease    heart stent  . Gout   . Heart disease   . History of nephrectomy   . Hyperlipidemia   . Hypertension     Surgical History: Past Surgical History:  Procedure Laterality Date  . COLONOSCOPY WITH PROPOFOL N/A 01/10/2017   Procedure: COLONOSCOPY WITH PROPOFOL;  Surgeon: Toledo, Benay Pike, MD;  Location: [facility] ENDOSCOPY;  Service: Gastroenterology;  Laterality: N/A;  . CORONARY ANGIOPLASTY WITH STENT PLACEMENT    . LEFT HEART CATH AND CORONARY ANGIOGRAPHY Right 01/24/2017   Procedure: LEFT HEART CATH AND CORONARY ANGIOGRAPHY;  Surgeon: Dionisio David, MD;  Location: [place] [facility];  Service: Cardiovascular;  Laterality: Right;  . NEPHRECTOMY Left     Home Medications:  Allergies as of 02/16/2017      Reactions   Penicillins Rash   Has patient had a PCN reaction causing immediate rash, facial/tongue/throat swelling, SOB or lightheadedness with hypotension: Yes Has patient had a PCN reaction causing severe rash involving mucus membranes or skin necrosis: No Has patient had a PCN reaction that  required hospitalization: No Has patient had a PCN reaction occurring within the last 10 years: No If all of the above answers are "NO", then may proceed with Cephalosporin use.      Medication List        Accurate as of 02/16/17  2:52 PM. Always use your most recent med list.          amLODipine 5 MG tablet Commonly known as:  NORVASC   aspirin EC 81 MG tablet Take 1 tablet (81 mg total) by mouth daily.   carvedilol 6.25 MG tablet Commonly known as:  COREG   clopidogrel  75 MG tablet Commonly known as:  PLAVIX Take 1 tablet (75 mg total) by mouth daily.   Colchicine 0.6 MG Caps Take 1 capsule by mouth daily.   lisinopril 10 MG tablet Commonly known as:  PRINIVIL,ZESTRIL   rosuvastatin 20 MG tablet Commonly known as:  CRESTOR       Allergies:  Allergies  Allergen Reactions  . Penicillins Rash    Has patient had a PCN reaction causing immediate rash, facial/tongue/throat swelling, SOB or lightheadedness with hypotension: Yes Has patient had a PCN reaction causing severe rash involving mucus membranes or skin necrosis: No Has patient had a PCN reaction that required hospitalization: No Has patient had a PCN reaction occurring within the last 10 years: No If all of the above answers are "NO", then may proceed with Cephalosporin use.     Family History: Family History  Problem Relation Age of Onset  . Prostate cancer Neg Hx   . Bladder Cancer Neg Hx   . Kidney cancer Neg Hx     Social History:  reports that  has never smoked. he has never used smokeless tobacco. He reports that he does not drink alcohol or use drugs.  ROS: UROLOGY Frequent Urination?: Yes Hard to postpone urination?: Yes Burning/pain with urination?: No Get up at night to urinate?: Yes Leakage of urine?: No Urine stream starts and stops?: No Trouble starting stream?: No Do you have to strain to urinate?: No Blood in urine?: No Urinary tract infection?: No Sexually transmitted disease?: No Injury to kidneys or bladder?: No Painful intercourse?: No Weak stream?: No Erection problems?: No Penile pain?: No  Gastrointestinal Nausea?: No Vomiting?: No Indigestion/heartburn?: No Diarrhea?: No Constipation?: No  Constitutional Fever: No Night sweats?: No Weight loss?: No Fatigue?: No  Skin Skin rash/lesions?: No Itching?: No  Eyes Blurred vision?: No Double vision?: No  Ears/Nose/Throat Sore throat?: No Sinus problems?:  No  Hematologic/Lymphatic Swollen glands?: No Easy bruising?: No  Cardiovascular Leg swelling?: No Chest pain?: No  Respiratory Cough?: No Shortness of breath?: No  Endocrine Excessive thirst?: No  Musculoskeletal Back pain?: No Joint pain?: No  Neurological Headaches?: No Dizziness?: No  Psychologic Depression?: No Anxiety?: No  Physical Exam: BP (!) 163/73 (BP Location: Left Arm, Patient Position: Sitting, Cuff Size: Normal)   Pulse 71   Ht 5\' 10"  (1.778 m)   Wt 183 lb 12.8 oz (83.4 kg)   BMI 26.37 kg/m   Constitutional:  Alert and oriented, No acute distress. HEENT: [place] AT, moist mucus membranes.  Trachea midline, no masses. Cardiovascular: No clubbing, cyanosis, or edema. Respiratory: Normal respiratory effort, no increased work of breathing. GI: Abdomen is soft, nontender, nondistended, no abdominal masses GU: No CVA tenderness.  Skin: No rashes, bruises or suspicious lesions. Lymph: No cervical or inguinal adenopathy. Neurologic: Grossly intact, no focal deficits, moving all 4 extremities. Psychiatric: Normal mood and  affect.  Laboratory Data: Lab Results  Component Value Date   WBC 7.5 01/24/2017   HGB 16.7 01/24/2017   HCT 50.1 01/24/2017   MCV 77.8 (L) 01/24/2017   PLT 193 01/24/2017    Lab Results  Component Value Date   CREATININE 0.95 01/24/2017    No results found for: PSA  No results found for: TESTOSTERONE  Lab Results  Component Value Date   HGBA1C 6.3 (H) 01/23/2017    Urinalysis    Component Value Date/Time   APPEARANCEUR Clear 08/17/2016 1449   GLUCOSEU Negative 08/17/2016 1449   BILIRUBINUR Negative 08/17/2016 1449   PROTEINUR Trace (A) 08/17/2016 1449   NITRITE Negative 08/17/2016 1449   LEUKOCYTESUR Negative 08/17/2016 1449    Pertinent Imaging: Renal ultrasound reviewed showing absent left kidney and 4 mm right lower pole calculus.  Assessment & Plan:    1. BPH As discussed at his last visit, I have  recommended against any medical or surgical management of this currently as this would further impair his fertility. We will address this once he is able to conceive a child or when he no longer desires conception.  2. Infertility Semen analysis is pending.  If it remains abnormal which I suspect it will given his history, we will refer him to [facility] for further infertility counseling.  3. Nephrolithiasis -4 mm right lower pole stone on renal ultrasound December 2018.  Discussed options with the patient given his high risk given his solitary kidney.  I did recommend against lithotripsy which he has had in the past due to his much higher risk of renal failure needing secondary procedure.  He is not interested in ureteroscopy at this time.  We will repeat his renal ultrasound in 6 months to ensure no significant growth.  He was given strict warnings to seek medical advice about the development of anuria or right flank pain.  4. Prostate cancer screening -Currently up to date.  We will repeat PSA/DRE in 6 months when due   Return in about 6 months (around 08/17/2017) for renal u/s and PSA prior.  Nickie Retort, MD  [facility] [address] [place], [place] [address] [phone]

## 2017-03-03 DIAGNOSIS — R0602 Shortness of breath: Secondary | ICD-10-CM | POA: Diagnosis not present

## 2017-03-03 DIAGNOSIS — E782 Mixed hyperlipidemia: Secondary | ICD-10-CM | POA: Diagnosis not present

## 2017-03-03 DIAGNOSIS — I2510 Atherosclerotic heart disease of native coronary artery without angina pectoris: Secondary | ICD-10-CM | POA: Diagnosis not present

## 2017-03-03 DIAGNOSIS — I10 Essential (primary) hypertension: Secondary | ICD-10-CM | POA: Diagnosis not present

## 2017-04-10 ENCOUNTER — Telehealth: Payer: Self-pay

## 2017-04-10 DIAGNOSIS — R868 Other abnormal findings in specimens from male genital organs: Secondary | ICD-10-CM

## 2017-04-10 NOTE — Telephone Encounter (Signed)
Pt called requesting semen analysis results. Noted results in chart. Please advise.

## 2017-04-10 NOTE — Telephone Encounter (Signed)
Spoke with pt in reference to semen analysis results and referral to Dr. Michaelle Copas. Pt voiced understanding. Referral placed.

## 2017-04-21 ENCOUNTER — Telehealth: Payer: Self-pay | Admitting: Urology

## 2017-04-21 NOTE — Telephone Encounter (Signed)
sent referral to [facility] fertility and patient did not want to schedule an app at this time. Just wanted you to be aware  Sharyn Lull

## 2017-06-01 DIAGNOSIS — I2510 Atherosclerotic heart disease of native coronary artery without angina pectoris: Secondary | ICD-10-CM | POA: Diagnosis not present

## 2017-06-01 DIAGNOSIS — E782 Mixed hyperlipidemia: Secondary | ICD-10-CM | POA: Diagnosis not present

## 2017-06-01 DIAGNOSIS — I214 Non-ST elevation (NSTEMI) myocardial infarction: Secondary | ICD-10-CM | POA: Diagnosis not present

## 2017-06-01 DIAGNOSIS — Z9861 Coronary angioplasty status: Secondary | ICD-10-CM | POA: Diagnosis not present

## 2017-08-14 ENCOUNTER — Other Ambulatory Visit: Payer: PPO

## 2017-08-15 DIAGNOSIS — I2510 Atherosclerotic heart disease of native coronary artery without angina pectoris: Secondary | ICD-10-CM | POA: Diagnosis not present

## 2017-08-15 DIAGNOSIS — I10 Essential (primary) hypertension: Secondary | ICD-10-CM | POA: Diagnosis not present

## 2017-08-15 DIAGNOSIS — M109 Gout, unspecified: Secondary | ICD-10-CM | POA: Diagnosis not present

## 2017-08-15 DIAGNOSIS — R010 Benign and innocent cardiac murmurs: Secondary | ICD-10-CM | POA: Diagnosis not present

## 2017-08-15 DIAGNOSIS — N400 Enlarged prostate without lower urinary tract symptoms: Secondary | ICD-10-CM | POA: Diagnosis not present

## 2017-08-17 ENCOUNTER — Ambulatory Visit
Admission: RE | Admit: 2017-08-17 | Discharge: 2017-08-17 | Disposition: A | Discharge status: Home / Self Care | Payer: PPO | Source: Ambulatory Visit | Attending: Urology | Admitting: Urology

## 2017-08-17 ENCOUNTER — Other Ambulatory Visit: Payer: PPO

## 2017-08-17 DIAGNOSIS — Z09 Encounter for follow-up examination after completed treatment for conditions other than malignant neoplasm: Secondary | ICD-10-CM | POA: Diagnosis not present

## 2017-08-17 DIAGNOSIS — N200 Calculus of kidney: Secondary | ICD-10-CM | POA: Insufficient documentation

## 2017-08-17 DIAGNOSIS — N400 Enlarged prostate without lower urinary tract symptoms: Secondary | ICD-10-CM | POA: Diagnosis not present

## 2017-08-18 ENCOUNTER — Encounter: Payer: Self-pay | Admitting: Urology

## 2017-08-18 ENCOUNTER — Ambulatory Visit (INDEPENDENT_AMBULATORY_CARE_PROVIDER_SITE_OTHER): Payer: PPO | Admitting: Urology

## 2017-08-18 VITALS — BP 133/78 | HR 78 | Ht 70.0 in | Wt 181.3 lb

## 2017-08-18 DIAGNOSIS — N138 Other obstructive and reflux uropathy: Secondary | ICD-10-CM

## 2017-08-18 DIAGNOSIS — R3912 Poor urinary stream: Secondary | ICD-10-CM

## 2017-08-18 DIAGNOSIS — N200 Calculus of kidney: Secondary | ICD-10-CM

## 2017-08-18 DIAGNOSIS — N469 Male infertility, unspecified: Secondary | ICD-10-CM | POA: Diagnosis not present

## 2017-08-18 DIAGNOSIS — N401 Enlarged prostate with lower urinary tract symptoms: Secondary | ICD-10-CM | POA: Diagnosis not present

## 2017-08-18 LAB — PSA: Prostate Specific Ag, Serum: 1.2 ng/mL (ref 0.0–4.0)

## 2017-08-18 NOTE — Progress Notes (Signed)
08/18/2017 11:10 AM   Zachary Gordon 1950/05/13 [id number]  Referring provider: Jodi Marble, MD [place], [place] [address]  Chief Complaint  Patient presents with  . Benign Prostatic Hypertrophy    HPI:  1. BPH - the patient notes a weak stream, urinary frequency, nocturia, intermittency, and straining to urinate. He has been on finasteride but stopped it in 2018 as he thought it may be impairing his ability to have a child with his wife -- PSA was 0.8. He has 1 to never tried Flomax before, but did not start. His LUTS improved off finasteride. Renal ultrasound was positive for intravesical lobe of the prostate.  2. Infertility - The patient has been trying to conceive a child with his wife for 8 years. She has been worked up and was told that she is normal. She is 63 + years of age. He has had a semen analysis in the past and was told that he had reduced sperm counts. They have tried in vitro fertilization 3 without success. They also tried IUI. He was on finasteride until 2018 when he stopped his he thought it is impairing his chances of fertility.   3. Nephrolithiasis - this had multiple surgeries including open pyelolithotomy resulting loss of left kidney. His most recent stone episodes was in 2010 which was treated with an attempt at right ureteroscopy that failed because it "could not be reached"  followed by lithotripsy.  Previous stone was calcium oxalate. Renal ultrasound shows a 4 mm nonobstructing right lower pole calculus.    4. History of left nephrectomy His left kidney was removed as result of an iatrogenic injury during stone surgery many years ago. It was not removed for malignant reasons.  [name] returns and his PSA is 1.2.  He underwent a repeat renal ultrasound which again showed a normal right kidney with possible small 4 mm right lower pole stones.  No change there.  Bladder was not distended.  Prostate noted to be enlarged with a small median  lobe. He has weak stream but is managing.   Patient also underwent a semen analysis a few months ago which revealed low volume, normal sperm concentration but low total count and normal pH.   As far as kidney stones, he has had no flank pain or stone passage.  PMH: Past Medical History:  Diagnosis Date  . Chronic kidney disease    stones  . Coronary artery disease    heart stent  . Gout   . Heart disease   . History of nephrectomy   . Hyperlipidemia   . Hypertension     Surgical History: Past Surgical History:  Procedure Laterality Date  . COLONOSCOPY WITH PROPOFOL N/A 01/10/2017   Procedure: COLONOSCOPY WITH PROPOFOL;  Surgeon: Toledo, Benay Pike, MD;  Location: [facility] ENDOSCOPY;  Service: Gastroenterology;  Laterality: N/A;  . CORONARY ANGIOPLASTY WITH STENT PLACEMENT    . LEFT HEART CATH AND CORONARY ANGIOGRAPHY Right 01/24/2017   Procedure: LEFT HEART CATH AND CORONARY ANGIOGRAPHY;  Surgeon: Dionisio David, MD;  Location: [place] [facility];  Service: Cardiovascular;  Laterality: Right;  . NEPHRECTOMY Left     Home Medications:  Allergies as of 08/18/2017      Reactions   Penicillins Rash   Has patient had a PCN reaction causing immediate rash, facial/tongue/throat swelling, SOB or lightheadedness with hypotension: Yes Has patient had a PCN reaction causing severe rash involving mucus membranes or skin necrosis: No Has patient had a  PCN reaction that required hospitalization: No Has patient had a PCN reaction occurring within the last 10 years: No If all of the above answers are "NO", then may proceed with Cephalosporin use.      Medication List        Accurate as of 08/18/17 11:10 AM. Always use your most recent med list.          amLODipine 5 MG tablet Commonly known as:  NORVASC   aspirin EC 81 MG tablet Take 1 tablet (81 mg total) by mouth daily.   carvedilol 6.25 MG tablet Commonly known as:  COREG   celecoxib 200 MG capsule Commonly known as:   CELEBREX TAKE 1 TO 2 CAPSULES BY MOUTH ONCE DAILY AS NEEDED   clopidogrel 75 MG tablet Commonly known as:  PLAVIX Take 1 tablet (75 mg total) by mouth daily.   Colchicine 0.6 MG Caps Take 1 capsule by mouth daily.   isosorbide mononitrate 30 MG 24 hr tablet Commonly known as:  IMDUR   lisinopril 10 MG tablet Commonly known as:  PRINIVIL,ZESTRIL   rosuvastatin 20 MG tablet Commonly known as:  CRESTOR       Allergies:  Allergies  Allergen Reactions  . Penicillins Rash    Has patient had a PCN reaction causing immediate rash, facial/tongue/throat swelling, SOB or lightheadedness with hypotension: Yes Has patient had a PCN reaction causing severe rash involving mucus membranes or skin necrosis: No Has patient had a PCN reaction that required hospitalization: No Has patient had a PCN reaction occurring within the last 10 years: No If all of the above answers are "NO", then may proceed with Cephalosporin use.     Family History: Family History  Problem Relation Age of Onset  . Prostate cancer Neg Hx   . Bladder Cancer Neg Hx   . Kidney cancer Neg Hx     Social History:  reports that he has never smoked. He has never used smokeless tobacco. He reports that he does not drink alcohol or use drugs.  ROS:                                        Physical Exam: BP 133/78 (BP Location: Right Arm, Patient Position: Sitting, Cuff Size: Normal)   Pulse 78   Ht 5\' 10"  (1.778 m)   Wt 82.2 kg (181 lb 4.8 oz)   BMI 26.01 kg/m   Constitutional:  Alert and oriented, No acute distress. HEENT: [place] AT, moist mucus membranes.  Trachea midline, no masses. Cardiovascular: No clubbing, cyanosis, or edema. Respiratory: Normal respiratory effort, no increased work of breathing. GI: Abdomen is soft, nontender, nondistended, no abdominal masses GU: No CVA tenderness DRE: Prostate 40 g, benign.  No hard area or nodule. Lymph: No cervical or inguinal  lymphadenopathy. Skin: No rashes, bruises or suspicious lesions. Neurologic: Grossly intact, no focal deficits, moving all 4 extremities. Psychiatric: Normal mood and affect.  Laboratory Data: Lab Results  Component Value Date   WBC 7.5 01/24/2017   HGB 16.7 01/24/2017   HCT 50.1 01/24/2017   MCV 77.8 (L) 01/24/2017   PLT 193 01/24/2017    Lab Results  Component Value Date   CREATININE 0.95 01/24/2017    No results found for: PSA  No results found for: TESTOSTERONE  Lab Results  Component Value Date   HGBA1C 6.3 (H) 01/23/2017    Urinalysis  Component Value Date/Time   APPEARANCEUR Clear 08/17/2016 1449   GLUCOSEU Negative 08/17/2016 1449   BILIRUBINUR Negative 08/17/2016 1449   PROTEINUR Trace (A) 08/17/2016 1449   NITRITE Negative 08/17/2016 1449   LEUKOCYTESUR Negative 08/17/2016 1449    Lab Results  Component Value Date   LABMICR See below: 08/17/2016   WBCUA 0-5 08/17/2016   RBCUA 3-10 (A) 08/17/2016   LABEPIT 0-10 08/17/2016   MUCUS Present (A) 08/17/2016   BACTERIA Few (A) 08/17/2016    Pertinent Imaging: Renal ultrasound June 2019, 2018-reviewed images No results found for this or any previous visit. No results found for this or any previous visit. No results found for this or any previous visit. No results found for this or any previous visit. Results for orders placed during the hospital encounter of 08/17/17  US RENAL   Narrative CLINICAL DATA:  Nephrolithiasis.  EXAM: RENAL / URINARY TRACT ULTRASOUND COMPLETE  COMPARISON:  Renal ultrasound dated February 06, 2017.  FINDINGS: Right Kidney:  Length: 15.2 cm. Echogenicity within normal limits. No hydronephrosis. Two small nonshadowing echogenic foci within the lower pole measuring up to 4 mm, similar to prior study. Unchanged simple cysts.  Left Kidney:  Surgically absent.  Bladder:  Enlarged prostate gland indenting the bladder base.  IMPRESSION: 1. Nonobstructing 4 mm  right renal calculi, unchanged.   Electronically Signed   By: Titus Dubin M.D.   On: 08/17/2017 15:34    No results found for this or any previous visit. No results found for this or any previous visit. No results found for this or any previous visit.  Assessment & Plan:    1) low semen volume- he has been off finasteride for about a year and we could repeat semen analysis.  Also recommended he take a supplement for fertility.  Also recommended they follow-up with [facility] infertility.  I showed him the website and he said he will discuss with his wife and possibly make an appointment.  2) BPH- patient managing on surveillance.  Normal PSA. DRE normal. .  3) nephrolithiasis- stable on ultrasound.  Will check a KUB at follow-up.  Discussed for him warning signs such as flank pain and decreased urine output and to seek medical attention immediately.  No follow-ups on file.  Festus Aloe, MD  [facility] [address] [place], [place] [address] [phone]

## 2017-11-22 DIAGNOSIS — R010 Benign and innocent cardiac murmurs: Secondary | ICD-10-CM | POA: Diagnosis not present

## 2017-11-22 DIAGNOSIS — Z23 Encounter for immunization: Secondary | ICD-10-CM | POA: Diagnosis not present

## 2017-11-22 DIAGNOSIS — I2510 Atherosclerotic heart disease of native coronary artery without angina pectoris: Secondary | ICD-10-CM | POA: Diagnosis not present

## 2017-11-22 DIAGNOSIS — I10 Essential (primary) hypertension: Secondary | ICD-10-CM | POA: Diagnosis not present

## 2017-11-22 DIAGNOSIS — Z1331 Encounter for screening for depression: Secondary | ICD-10-CM | POA: Diagnosis not present

## 2017-11-22 DIAGNOSIS — M109 Gout, unspecified: Secondary | ICD-10-CM | POA: Diagnosis not present

## 2017-12-04 DIAGNOSIS — Z1211 Encounter for screening for malignant neoplasm of colon: Secondary | ICD-10-CM | POA: Diagnosis not present

## 2017-12-29 DIAGNOSIS — I2510 Atherosclerotic heart disease of native coronary artery without angina pectoris: Secondary | ICD-10-CM | POA: Diagnosis not present

## 2018-01-03 DIAGNOSIS — I10 Essential (primary) hypertension: Secondary | ICD-10-CM | POA: Diagnosis not present

## 2018-01-03 DIAGNOSIS — I2510 Atherosclerotic heart disease of native coronary artery without angina pectoris: Secondary | ICD-10-CM | POA: Diagnosis not present

## 2018-01-03 DIAGNOSIS — R7303 Prediabetes: Secondary | ICD-10-CM | POA: Diagnosis not present

## 2018-01-03 DIAGNOSIS — M109 Gout, unspecified: Secondary | ICD-10-CM | POA: Diagnosis not present

## 2018-01-03 DIAGNOSIS — R010 Benign and innocent cardiac murmurs: Secondary | ICD-10-CM | POA: Diagnosis not present

## 2018-01-25 DIAGNOSIS — D126 Benign neoplasm of colon, unspecified: Secondary | ICD-10-CM | POA: Diagnosis not present

## 2018-01-25 DIAGNOSIS — I10 Essential (primary) hypertension: Secondary | ICD-10-CM | POA: Diagnosis not present

## 2018-01-25 DIAGNOSIS — I214 Non-ST elevation (NSTEMI) myocardial infarction: Secondary | ICD-10-CM | POA: Diagnosis not present

## 2018-01-25 DIAGNOSIS — Z1211 Encounter for screening for malignant neoplasm of colon: Secondary | ICD-10-CM | POA: Diagnosis not present

## 2018-01-25 DIAGNOSIS — Z7901 Long term (current) use of anticoagulants: Secondary | ICD-10-CM | POA: Diagnosis not present

## 2018-01-25 DIAGNOSIS — R195 Other fecal abnormalities: Secondary | ICD-10-CM | POA: Diagnosis not present

## 2018-02-02 DIAGNOSIS — H903 Sensorineural hearing loss, bilateral: Secondary | ICD-10-CM | POA: Diagnosis not present

## 2018-02-08 ENCOUNTER — Encounter: Payer: Self-pay | Admitting: Urology

## 2018-02-08 ENCOUNTER — Ambulatory Visit: Payer: PPO | Admitting: Urology

## 2018-02-09 DIAGNOSIS — H903 Sensorineural hearing loss, bilateral: Secondary | ICD-10-CM | POA: Diagnosis not present

## 2018-02-12 DIAGNOSIS — R0602 Shortness of breath: Secondary | ICD-10-CM | POA: Diagnosis not present

## 2018-02-12 DIAGNOSIS — I25119 Atherosclerotic heart disease of native coronary artery with unspecified angina pectoris: Secondary | ICD-10-CM | POA: Diagnosis not present

## 2018-02-12 DIAGNOSIS — I2510 Atherosclerotic heart disease of native coronary artery without angina pectoris: Secondary | ICD-10-CM | POA: Diagnosis not present

## 2018-02-12 DIAGNOSIS — I10 Essential (primary) hypertension: Secondary | ICD-10-CM | POA: Diagnosis not present

## 2018-02-12 DIAGNOSIS — E782 Mixed hyperlipidemia: Secondary | ICD-10-CM | POA: Diagnosis not present

## 2018-02-12 DIAGNOSIS — Z9861 Coronary angioplasty status: Secondary | ICD-10-CM | POA: Diagnosis not present

## 2018-02-19 ENCOUNTER — Encounter: Payer: Self-pay | Admitting: *Deleted

## 2018-02-20 ENCOUNTER — Encounter
Admission: RE | Disposition: A | Discharge status: Home / Self Care | Payer: Self-pay | Source: Home / Self Care | Attending: Internal Medicine

## 2018-02-20 ENCOUNTER — Ambulatory Visit
Admission: RE | Admit: 2018-02-20 | Discharge: 2018-02-20 | Disposition: A | Discharge status: Home / Self Care | Payer: PPO | Attending: Internal Medicine | Admitting: Internal Medicine

## 2018-02-20 ENCOUNTER — Encounter: Payer: Self-pay | Admitting: *Deleted

## 2018-02-20 ENCOUNTER — Ambulatory Visit: Payer: PPO | Admitting: Certified Registered"

## 2018-02-20 DIAGNOSIS — D122 Benign neoplasm of ascending colon: Secondary | ICD-10-CM | POA: Diagnosis not present

## 2018-02-20 DIAGNOSIS — D124 Benign neoplasm of descending colon: Secondary | ICD-10-CM | POA: Diagnosis not present

## 2018-02-20 DIAGNOSIS — I2510 Atherosclerotic heart disease of native coronary artery without angina pectoris: Secondary | ICD-10-CM | POA: Diagnosis not present

## 2018-02-20 DIAGNOSIS — K5730 Diverticulosis of large intestine without perforation or abscess without bleeding: Secondary | ICD-10-CM | POA: Diagnosis not present

## 2018-02-20 DIAGNOSIS — Z86010 Personal history of colonic polyps: Secondary | ICD-10-CM | POA: Insufficient documentation

## 2018-02-20 DIAGNOSIS — Z7982 Long term (current) use of aspirin: Secondary | ICD-10-CM | POA: Diagnosis not present

## 2018-02-20 DIAGNOSIS — D123 Benign neoplasm of transverse colon: Secondary | ICD-10-CM | POA: Diagnosis not present

## 2018-02-20 DIAGNOSIS — M109 Gout, unspecified: Secondary | ICD-10-CM | POA: Insufficient documentation

## 2018-02-20 DIAGNOSIS — Z905 Acquired absence of kidney: Secondary | ICD-10-CM | POA: Insufficient documentation

## 2018-02-20 DIAGNOSIS — I252 Old myocardial infarction: Secondary | ICD-10-CM | POA: Insufficient documentation

## 2018-02-20 DIAGNOSIS — I129 Hypertensive chronic kidney disease with stage 1 through stage 4 chronic kidney disease, or unspecified chronic kidney disease: Secondary | ICD-10-CM | POA: Insufficient documentation

## 2018-02-20 DIAGNOSIS — Z79899 Other long term (current) drug therapy: Secondary | ICD-10-CM | POA: Insufficient documentation

## 2018-02-20 DIAGNOSIS — Z955 Presence of coronary angioplasty implant and graft: Secondary | ICD-10-CM | POA: Diagnosis not present

## 2018-02-20 DIAGNOSIS — Z7902 Long term (current) use of antithrombotics/antiplatelets: Secondary | ICD-10-CM | POA: Diagnosis not present

## 2018-02-20 DIAGNOSIS — D126 Benign neoplasm of colon, unspecified: Secondary | ICD-10-CM | POA: Diagnosis not present

## 2018-02-20 DIAGNOSIS — R195 Other fecal abnormalities: Secondary | ICD-10-CM | POA: Insufficient documentation

## 2018-02-20 DIAGNOSIS — E785 Hyperlipidemia, unspecified: Secondary | ICD-10-CM | POA: Diagnosis not present

## 2018-02-20 DIAGNOSIS — K635 Polyp of colon: Secondary | ICD-10-CM | POA: Diagnosis not present

## 2018-02-20 DIAGNOSIS — K648 Other hemorrhoids: Secondary | ICD-10-CM | POA: Diagnosis not present

## 2018-02-20 DIAGNOSIS — N189 Chronic kidney disease, unspecified: Secondary | ICD-10-CM | POA: Insufficient documentation

## 2018-02-20 HISTORY — DX: Personal history of urinary calculi: Z87.442

## 2018-02-20 HISTORY — DX: Acute myocardial infarction, unspecified: I21.9

## 2018-02-20 HISTORY — PX: COLONOSCOPY WITH PROPOFOL: SHX5780

## 2018-02-20 HISTORY — DX: Cardiac murmur, unspecified: R01.1

## 2018-02-20 SURGERY — COLONOSCOPY WITH PROPOFOL
Anesthesia: General

## 2018-02-20 MED ORDER — LIDOCAINE HCL (PF) 2 % IJ SOLN
INTRAMUSCULAR | Status: AC
  Filled 2018-02-20: qty 10

## 2018-02-20 MED ORDER — LIDOCAINE HCL (CARDIAC) PF 100 MG/5ML IV SOSY
PREFILLED_SYRINGE | INTRAVENOUS | Status: DC | PRN
  Administered 2018-02-20: 50 mg via INTRATRACHEAL

## 2018-02-20 MED ORDER — SODIUM CHLORIDE 0.9 % IV SOLN
INTRAVENOUS | Status: DC
  Administered 2018-02-20: 1000 mL via INTRAVENOUS

## 2018-02-20 MED ORDER — PROPOFOL 10 MG/ML IV BOLUS
INTRAVENOUS | Status: DC | PRN
  Administered 2018-02-20: 20 mg via INTRAVENOUS
  Administered 2018-02-20: 30 mg via INTRAVENOUS
  Administered 2018-02-20: 40 mg via INTRAVENOUS

## 2018-02-20 MED ORDER — PROPOFOL 500 MG/50ML IV EMUL
INTRAVENOUS | Status: AC
  Filled 2018-02-20: qty 50

## 2018-02-20 MED ORDER — PROPOFOL 500 MG/50ML IV EMUL
INTRAVENOUS | Status: DC | PRN
  Administered 2018-02-20: 100 ug/kg/min via INTRAVENOUS

## 2018-02-20 NOTE — Op Note (Signed)
[facility] Gastroenterology Patient Name: Zachary Gordon Procedure Date: 02/20/2018 1:12 PM MRN: [id number] Account #: [id number] Date of Birth: Sep 27, 1950 Admit Type: Outpatient Age: 67 Room: [facility] ENDO ROOM 2 Gender: Male Note Status: Finalized Procedure:            Colonoscopy Indications:          Heme positive stool Providers:            Benay Pike. Alice Reichert MD, MD Referring MD:         No Local Md, MD (Referring MD) Medicines:            Propofol per Anesthesia Complications:        No immediate complications. Procedure:            Pre-Anesthesia Assessment:                       - The risks and benefits of the procedure and the                        sedation options and risks were discussed with the                        patient. All questions were answered and informed                        consent was obtained.                       - Patient identification and proposed procedure were                        verified prior to the procedure by the nurse. The                        procedure was verified in the procedure room.                       - ASA Grade Assessment: III - A patient with severe                        systemic disease.                       - After reviewing the risks and benefits, the patient                        was deemed in satisfactory condition to undergo the                        procedure.                       After obtaining informed consent, the colonoscope was                        passed under direct vision. Throughout the procedure,                        the patient's blood pressure, pulse, and oxygen  saturations were monitored continuously. The                        Colonoscope was introduced through the anus and                        advanced to the the cecum, identified by appendiceal                        orifice and ileocecal valve. The colonoscopy was                        performed  without difficulty. The patient tolerated the                        procedure well. The quality of the bowel preparation                        was good. The ileocecal valve, appendiceal orifice, and                        rectum were photographed. Findings:      The perianal and digital rectal examinations were normal. Pertinent       negatives include normal sphincter tone and no palpable rectal lesions.      A few small-mouthed diverticula were found in the sigmoid colon.      Two sessile polyps were found in the transverse colon and distal       ascending colon. The polyps were 3 to 4 mm in size. These polyps were       removed with a jumbo cold forceps. Resection and retrieval were complete.      The exam was otherwise without abnormality on direct and retroflexion       views. Impression:           - Diverticulosis in the sigmoid colon.                       - Two 3 to 4 mm polyps in the transverse colon and in                        the distal ascending colon, removed with a jumbo cold                        forceps. Resected and retrieved.                       - The examination was otherwise normal on direct and                        retroflexion views. Recommendation:       - Patient has a contact number available for                        emergencies. The signs and symptoms of potential                        delayed complications were discussed with the patient.  Return to normal activities tomorrow. Written discharge                        instructions were provided to the patient.                       - Resume previous diet.                       - Continue present medications.                       - Repeat colonoscopy in 5 years for surveillance.                       - Advise NO further ANNUAL Hemoccult or FIT testing of                        stool in the interim.                       - Return to GI office PRN.                       - The  findings and recommendations were discussed with                        the patient and their family. Procedure Code(s):    --- Professional ---                       [phone], Colonoscopy, flexible; with biopsy, single or                        multiple Diagnosis Code(s):    --- Professional ---                       K57.30, Diverticulosis of large intestine without                        perforation or abscess without bleeding                       R19.5, Other fecal abnormalities                       D12.2, Benign neoplasm of ascending colon                       D12.3, Benign neoplasm of transverse colon (hepatic                        flexure or splenic flexure) CPT copyright 2018 American Medical Association. All rights reserved. The codes documented in this report are preliminary and upon coder review may  be revised to meet current compliance requirements. Efrain Sella MD, MD 02/20/2018 1:42:06 PM This report has been signed electronically. Number of Addenda: 0 Note Initiated On: 02/20/2018 1:12 PM Scope Withdrawal Time: 0 hours 6 minutes 36 seconds  Total Procedure Duration: 0 hours 10 minutes 46 seconds       [facility]

## 2018-02-20 NOTE — Transfer of Care (Signed)
Immediate Anesthesia Transfer of Care Note  Patient: Zachary Gordon  Procedure(s) Performed: COLONOSCOPY WITH PROPOFOL (N/A )  Patient Location: Endoscopy Unit  Anesthesia Type:General  Level of Consciousness: drowsy  Airway & Oxygen Therapy: Patient Spontanous Breathing and Patient connected to nasal cannula oxygen  Post-op Assessment: Report given to RN and Post -op Vital signs reviewed and stable  Post vital signs: stable  Last Vitals:  Vitals Value Taken Time  BP 117/71 02/20/2018  1:44 PM  Temp 36.2 C 02/20/2018  1:46 PM  Pulse 66 02/20/2018  1:46 PM  Resp 17 02/20/2018  1:46 PM  SpO2 97 % 02/20/2018  1:46 PM  Vitals shown include unvalidated device data.  Last Pain:  Vitals:   02/20/18 1344  TempSrc:   PainSc: 0-No pain      Patients Stated Pain Goal: 0 (79/15/04 1364)  Complications: No apparent anesthesia complications

## 2018-02-20 NOTE — Anesthesia Preprocedure Evaluation (Signed)
Anesthesia Evaluation  Patient identified by MRN, date of birth, ID band Patient awake    Reviewed: Allergy & Precautions, NPO status , Patient's Chart, lab work & pertinent test results  History of Anesthesia Complications Negative for: history of anesthetic complications  Airway Mallampati: II  TM Distance: >3 FB Neck ROM: Full    Dental no notable dental hx.    Pulmonary neg pulmonary ROS, neg sleep apnea, neg COPD,    breath sounds clear to auscultation- rhonchi (-) wheezing      Cardiovascular Exercise Tolerance: Good hypertension, Pt. on medications + CAD, + Past MI and + Cardiac Stents  (-) CABG  Rhythm:Regular Rate:Normal - Systolic murmurs and - Diastolic murmurs    Neuro/Psych neg Seizures negative neurological ROS  negative psych ROS   GI/Hepatic negative GI ROS, Neg liver ROS,   Endo/Other  negative endocrine ROSneg diabetes  Renal/GU Renal disease: hx of nephrolithiasis.     Musculoskeletal negative musculoskeletal ROS (+)   Abdominal (+) - obese,   Peds  Hematology negative hematology ROS (+)   Anesthesia Other Findings Past Medical History: No date: Chronic kidney disease     Comment:  stones No date: Coronary artery disease     Comment:  heart stent No date: Gout No date: Heart disease No date: Heart murmur No date: History of kidney stones No date: History of nephrectomy No date: Hyperlipidemia No date: Hypertension No date: Myocardial infarction [facility])   Reproductive/Obstetrics                             Anesthesia Physical Anesthesia Plan  ASA: III  Anesthesia Plan: General   Post-op Pain Management:    Induction: Intravenous  PONV Risk Score and Plan: 1 and Propofol infusion  Airway Management Planned: Natural Airway  Additional Equipment:   Intra-op Plan:   Post-operative Plan:   Informed Consent: I have reviewed the patients History and  Physical, chart, labs and discussed the procedure including the risks, benefits and alternatives for the proposed anesthesia with the patient or authorized representative who has indicated his/her understanding and acceptance.   Dental advisory given  Plan Discussed with: CRNA and Anesthesiologist  Anesthesia Plan Comments:         Anesthesia Quick Evaluation

## 2018-02-20 NOTE — Interval H&P Note (Signed)
History and Physical Interval Note:  02/20/2018 12:55 PM  Zachary Gordon  has presented today for surgery, with the diagnosis of HEME POSITIVE STOOL  The various methods of treatment have been discussed with the patient and family. After consideration of risks, benefits and other options for treatment, the patient has consented to  Procedure(s): COLONOSCOPY WITH PROPOFOL (N/A) as a surgical intervention .  The patient's history has been reviewed, patient examined, no change in status, stable for surgery.  I have reviewed the patient's chart and labs.  Questions were answered to the patient's satisfaction.     Lund, [place]

## 2018-02-20 NOTE — H&P (Signed)
Outpatient short stay form Pre-procedure 02/20/2018 12:48 PM  K. Alice Reichert, M.D.  Primary Physician:  Volanda Napoleon, M.D.  Reason for visit:  Heme positive stool, personal hx of colon polyps  History of present illness:  Patient is a 67 y/o male presenting for hemoccult positive stool. Patient denies change in bowel habits, rectal bleeding, weight loss or abdominal pain. Has personal hx of tubular adenomatous polyp.   No current facility-administered medications for this encounter.   Medications Prior to Admission  Medication Sig Dispense Refill Last Dose  . amLODipine (NORVASC) 5 MG tablet    02/19/2018 at Unknown time  . aspirin EC 81 MG tablet Take 1 tablet (81 mg total) by mouth daily. 30 tablet 1 Past Week at Unknown time  . carvedilol (COREG) 6.25 MG tablet    02/19/2018 at Unknown time  . celecoxib (CELEBREX) 200 MG capsule TAKE 1 TO 2 CAPSULES BY MOUTH ONCE DAILY AS NEEDED  0 02/19/2018 at Unknown time  . clopidogrel (PLAVIX) 75 MG tablet Take 1 tablet (75 mg total) by mouth daily. 30 tablet 1 Past Week at Unknown time  . Colchicine 0.6 MG CAPS Take 1 capsule by mouth daily.   02/19/2018 at Unknown time  . isosorbide mononitrate (IMDUR) 30 MG 24 hr tablet    02/19/2018 at Unknown time  . lisinopril (PRINIVIL,ZESTRIL) 10 MG tablet    02/19/2018 at Unknown time  . rosuvastatin (CRESTOR) 20 MG tablet    02/19/2018 at Unknown time     Allergies  Allergen Reactions  . Penicillins Rash    Has patient had a PCN reaction causing immediate rash, facial/tongue/throat swelling, SOB or lightheadedness with hypotension: Yes Has patient had a PCN reaction causing severe rash involving mucus membranes or skin necrosis: No Has patient had a PCN reaction that required hospitalization: No Has patient had a PCN reaction occurring within the last 10 years: No If all of the above answers are "NO", then may proceed with Cephalosporin use.      Past Medical History:  Diagnosis  Date  . Chronic kidney disease    stones  . Coronary artery disease    heart stent  . Gout   . Heart disease   . Heart murmur   . History of kidney stones   . History of nephrectomy   . Hyperlipidemia   . Hypertension   . Myocardial infarction [facility])     Review of systems:  Otherwise negative.    Physical Exam  Gen: Alert, oriented. Appears stated age.  HEENT: [place]/AT. PERRLA. Lungs: CTA, no wheezes. CV: RR nl S1, S2. Abd: soft, benign, no masses. BS+ Ext: No edema. Pulses 2+    Planned procedures: Proceed with colonoscopy. The patient understands the nature of the planned procedure, indications, risks, alternatives and potential complications including but not limited to bleeding, infection, perforation, damage to internal organs and possible oversedation/side effects from anesthesia. The patient agrees and gives consent to proceed.  Please refer to procedure notes for findings, recommendations and patient disposition/instructions.      K. Alice Reichert, M.D. Gastroenterology 02/20/2018  12:48 PM

## 2018-02-20 NOTE — Anesthesia Post-op Follow-up Note (Signed)
Anesthesia QCDR form completed.        

## 2018-02-20 NOTE — Anesthesia Postprocedure Evaluation (Signed)
Anesthesia Post Note  Patient: Zachary Gordon  Procedure(s) Performed: COLONOSCOPY WITH PROPOFOL (N/A )  Patient location during evaluation: Endoscopy Anesthesia Type: General Level of consciousness: awake and alert and oriented Pain management: pain level controlled Vital Signs Assessment: post-procedure vital signs reviewed and stable Respiratory status: spontaneous breathing, nonlabored ventilation and respiratory function stable Cardiovascular status: blood pressure returned to baseline and stable Postop Assessment: no signs of nausea or vomiting Anesthetic complications: no     Last Vitals:  Vitals:   02/20/18 1403 02/20/18 1404  BP:  118/80  Pulse: 73 72  Resp: (!) 26 16  Temp:    SpO2: 96% 96%    Last Pain:  Vitals:   02/20/18 1404  TempSrc:   PainSc: 0-No pain                 Yasmyn Bellisario

## 2018-02-22 ENCOUNTER — Encounter: Payer: Self-pay | Admitting: Internal Medicine

## 2018-02-23 LAB — SURGICAL PATHOLOGY

## 2018-11-13 DIAGNOSIS — Z9861 Coronary angioplasty status: Secondary | ICD-10-CM | POA: Insufficient documentation

## 2019-06-21 ENCOUNTER — Ambulatory Visit: Payer: PPO | Attending: Internal Medicine

## 2019-06-21 DIAGNOSIS — Z23 Encounter for immunization: Secondary | ICD-10-CM

## 2019-06-21 NOTE — Progress Notes (Signed)
   Covid-19 Vaccination Clinic  Name:  Zachary Gordon    MRN: [id number] DOB: 01/28/1951  06/21/2019  [name] was observed post Covid-19 immunization for 15 minutes without incident. He was provided with Vaccine Information Sheet and instruction to access the V-Safe system.   [name] was instructed to call 911 with any severe reactions post vaccine: Marland Kitchen Difficulty breathing  . Swelling of face and throat  . A fast heartbeat  . A bad rash all over body  . Dizziness and weakness   Immunizations Administered    Name Date Dose VIS Date Route   Pfizer COVID-19 Vaccine 06/21/2019 10:54 AM 0.3 mL 04/17/2018 Intramuscular   Manufacturer: [place]   Lot: [id number]   [place]: [id number]

## 2019-06-30 IMAGING — US US RENAL
1 series · 14 of 25 positions shown · non-contrast
Comparison: None.

CLINICAL DATA: 66-year-old male with kidney stones. Left kidney
removed in [REDACTED]. Initial encounter.

EXAM:
RENAL / URINARY TRACT ULTRASOUND COMPLETE

[Series 1: us renal · 0.22mm/px · 14 of 40 slices shown]
[im 1/40]
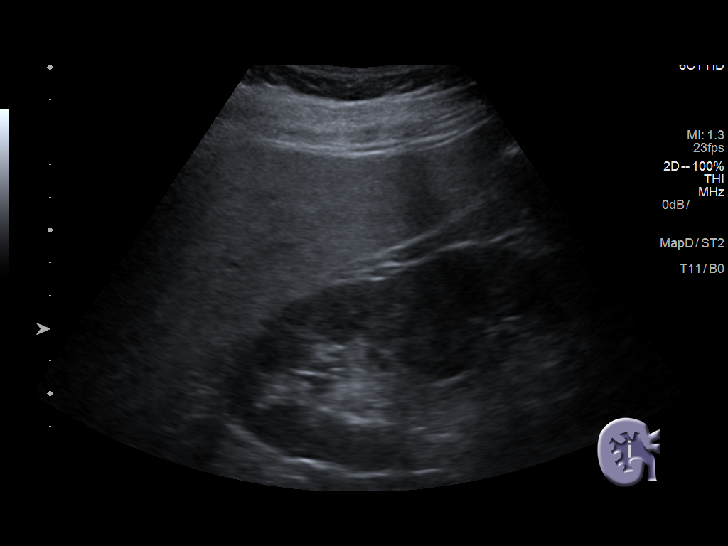
[im 4/40]
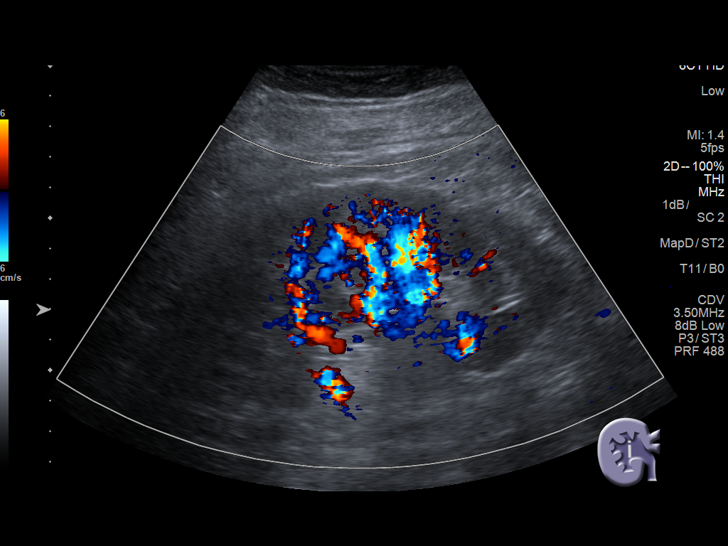
[im 7/40]
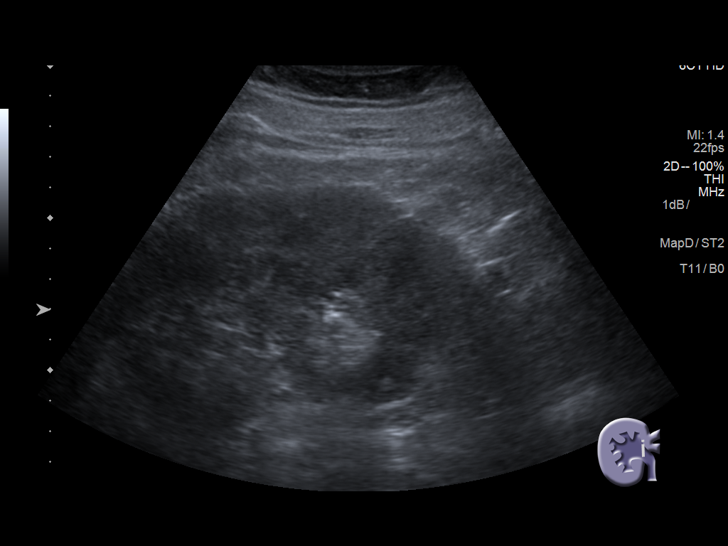
[im 10/40]
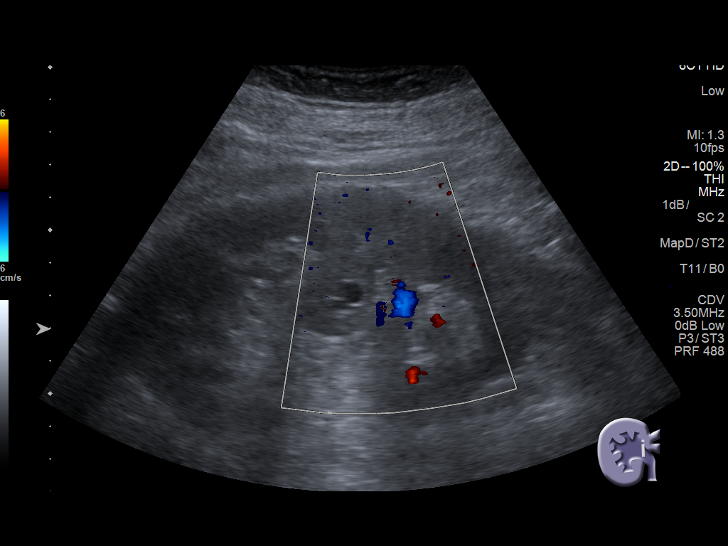
[im 14/40]
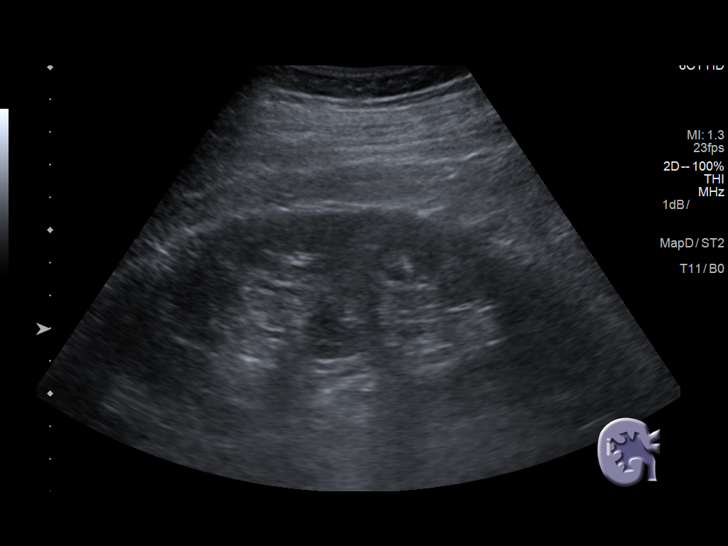
[im 15/40]
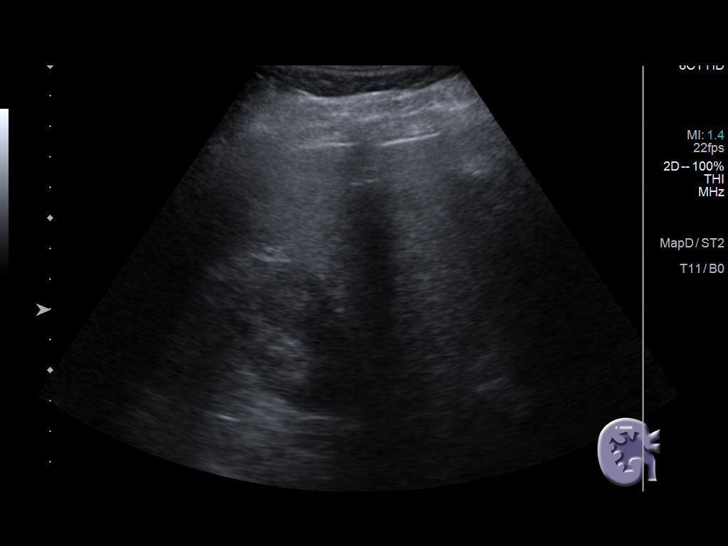
[im 18/40]
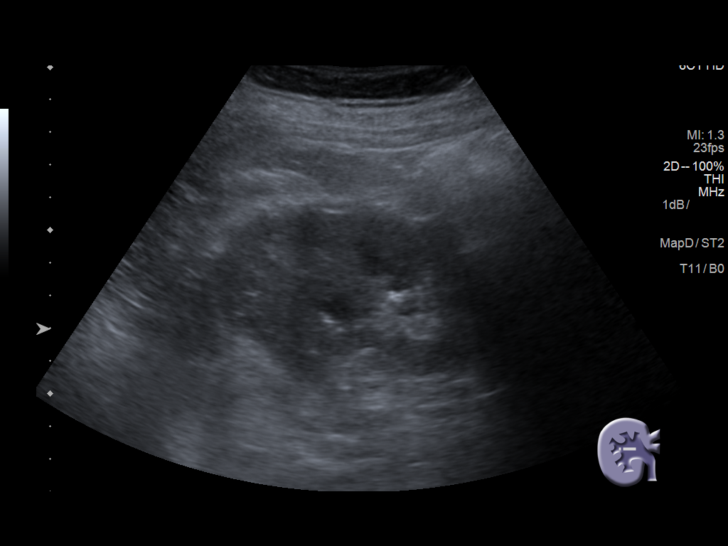
[im 22/40]
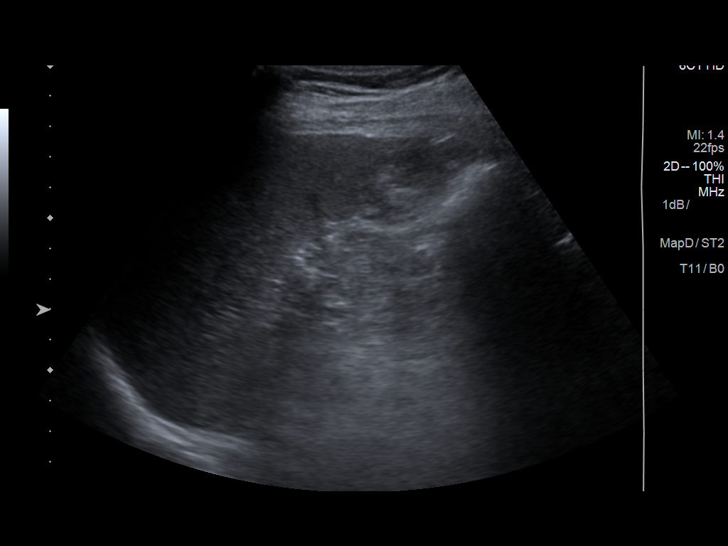
[im 25/40]
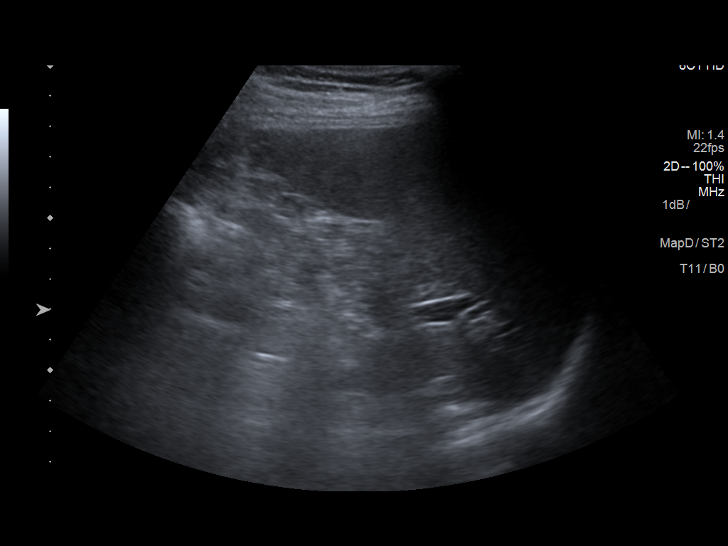
[im 27/40]
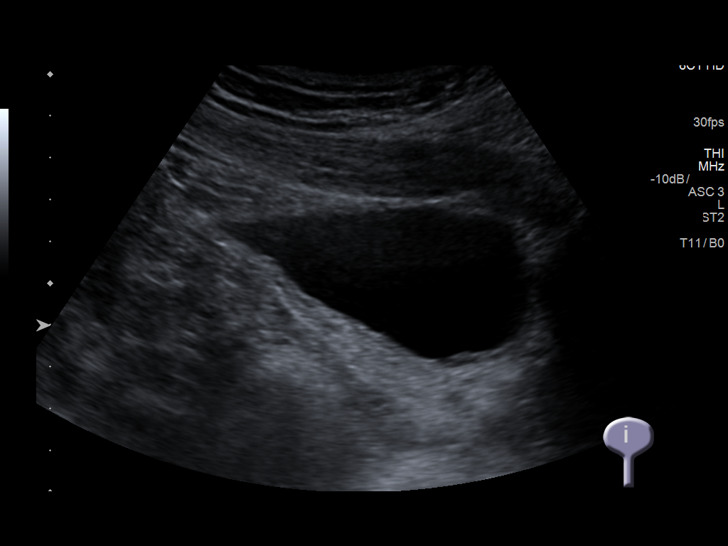
[im 30/40]
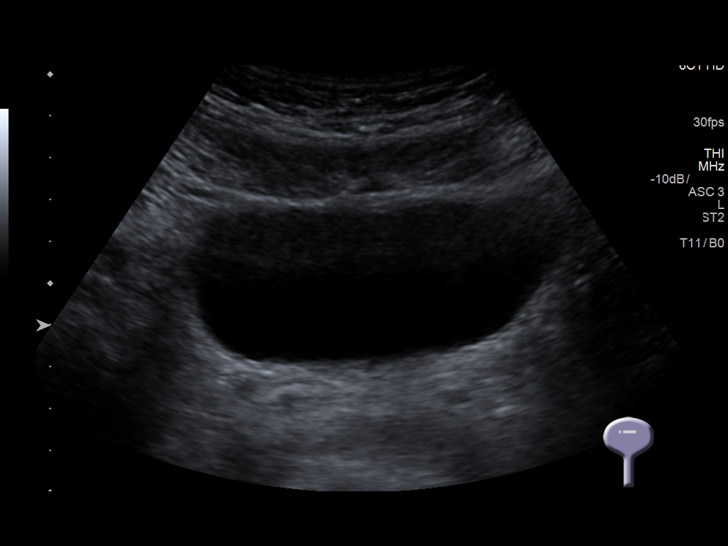
[im 33/40]
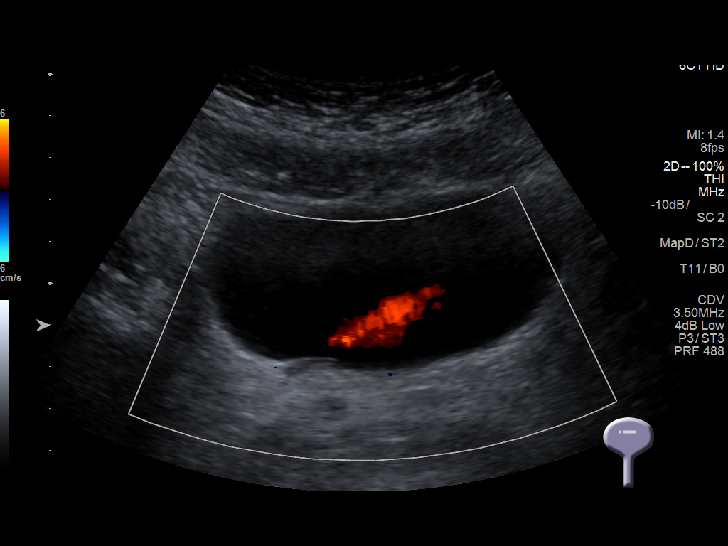
[im 36/40]
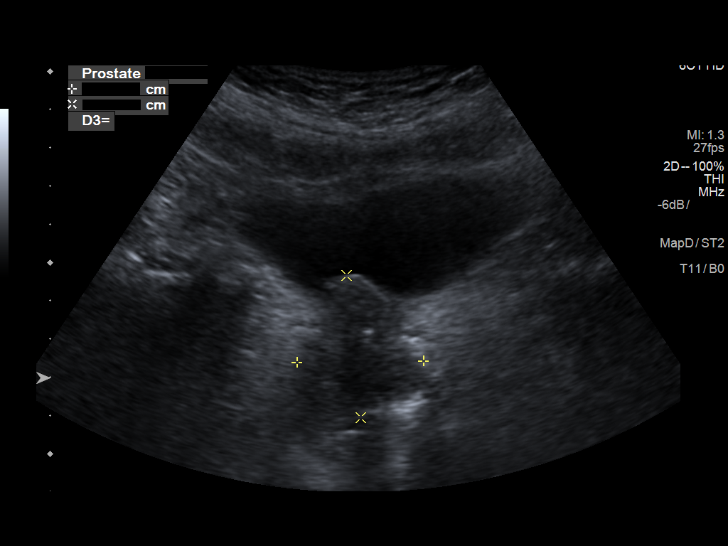
[im 40/40]
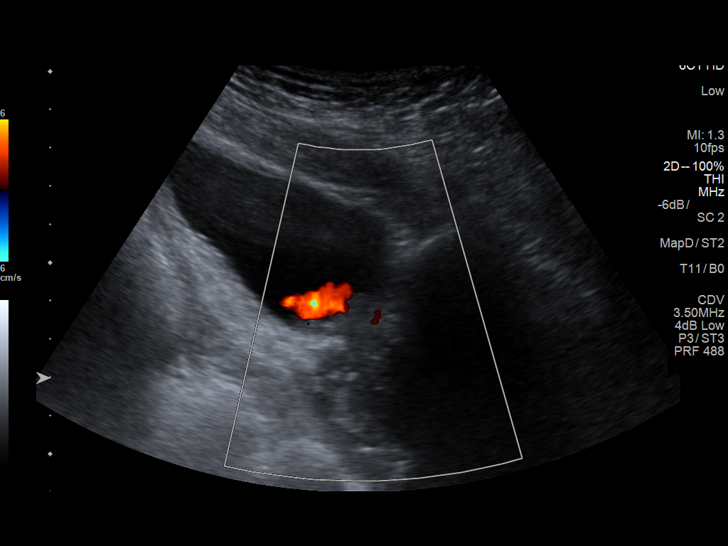

[14 of 25 positions shown; findings below may reference images not displayed]

FINDINGS: Right Kidney:

Length: 15.1 cm. No hydronephrosis. Nonobstructing 4 mm right lower
pole calculus. Subcentimeter cyst mid aspect right kidney.

Left Kidney:

Surgically removed.

Bladder:

Impression upon the bladder base by lobulated prostate gland.
IMPRESSION: Post left nephrectomy.

Nonobstructing 4 mm right lower pole renal calculus. Subcentimeter
cyst mid aspect right kidney.

Lobulated prostate gland impresses upon the bladder base. Clinical
and laboratory correlation recommended.

## 2019-07-16 ENCOUNTER — Ambulatory Visit: Payer: PPO | Attending: Internal Medicine

## 2019-07-16 DIAGNOSIS — Z23 Encounter for immunization: Secondary | ICD-10-CM

## 2019-07-16 NOTE — Progress Notes (Signed)
   Covid-19 Vaccination Clinic  Name:  Zachary Gordon    MRN: [id number] DOB: [birth year]  07/16/2019  [name] was observed post Covid-19 immunization for 15 minutes without incident. He was provided with Vaccine Information Sheet and instruction to access the V-Safe system.   [name] was instructed to call 911 with any severe reactions post vaccine: Marland Kitchen Difficulty breathing  . Swelling of face and throat  . A fast heartbeat  . A bad rash all over body  . Dizziness and weakness   Immunizations Administered    Name Date Dose VIS Date Route   Pfizer COVID-19 Vaccine 07/16/2019 10:36 AM 0.3 mL 04/17/2018 Intramuscular   Manufacturer: [employer]   Lot: [id number]   [place]: [id number]

## 2019-11-26 ENCOUNTER — Ambulatory Visit: Payer: PPO | Admitting: *Deleted

## 2019-11-27 ENCOUNTER — Encounter: Payer: Medicare Other | Attending: Internal Medicine | Admitting: *Deleted

## 2019-11-27 ENCOUNTER — Other Ambulatory Visit: Payer: Self-pay

## 2019-11-27 ENCOUNTER — Encounter: Payer: Self-pay | Admitting: *Deleted

## 2019-11-27 VITALS — BP 140/72 | Ht 70.0 in | Wt 173.6 lb

## 2019-11-27 DIAGNOSIS — E119 Type 2 diabetes mellitus without complications: Secondary | ICD-10-CM | POA: Diagnosis present

## 2019-11-27 NOTE — Progress Notes (Signed)
Diabetes Self-Management Education  Visit Type: First/Initial  Appt. Start Time: 1410 Appt. End Time: 1856  11/27/2019  [name], identified by name and date of birth, is a 69 y.o. male with a diagnosis of Diabetes: Type 2.   ASSESSMENT  Blood pressure 140/72, height 5\' 10"  (1.778 m), weight 173 lb 9.6 oz (78.7 kg). Body mass index is 24.91 kg/m.   Diabetes Self-Management Education - 11/27/19 1552      Visit Information   Visit Type First/Initial      Initial Visit   Diabetes Type Type 2    Are you currently following a meal plan? Yes    What type of meal plan do you follow? "stopped using sugar"    Are you taking your medications as prescribed? Yes   Begin Metformin noted on MD office notes but patient reports he doesn't have this medication.    Date Diagnosed September 2021      Health Coping   How would you rate your overall health? Good      Psychosocial Assessment   Patient Belief/Attitude about Diabetes Motivated to manage diabetes   "I don't like it." Pt reports he thinks he his A1C was elevated because he was on vacation for several months and ate many sweets."   Self-care barriers None    Self-management support Doctor's office;Family    Patient Concerns Nutrition/Meal planning;Medication;Glycemic Control;Monitoring;Healthy Lifestyle    Special Needs None    Preferred Learning Style Hands on    Learning Readiness Change in progress    How often do you need to have someone help you when you read instructions, pamphlets, or other written materials from your doctor or pharmacy? 1 - Never    What is the last grade level you completed in school? Masters      Pre-Education Assessment   Patient understands the diabetes disease and treatment process. Needs Instruction    Patient understands incorporating nutritional management into lifestyle. Needs Instruction    Patient undertands incorporating physical activity into lifestyle. Needs Review    Patient  understands using medications safely. Needs Instruction    Patient understands monitoring blood glucose, interpreting and using results Needs Instruction    Patient understands prevention, detection, and treatment of acute complications. Needs Instruction    Patient understands prevention, detection, and treatment of chronic complications. Needs Instruction    Patient understands how to develop strategies to address psychosocial issues. Needs Instruction    Patient understands how to develop strategies to promote health/change behavior. Needs Instruction      Complications   Last HgB A1C per patient/outside source 6.5 %   10/29/2019   How often do you check your blood sugar? 0 times/day (not testing)   Pt reports he has a meter but hasn't started using it. BG today in the office was 147 mg/dL at 3:25 pm - 5 1/2 hrs pp.   Have you had a dilated eye exam in the past 12 months? Yes    Have you had a dental exam in the past 12 months? No   has an appt tomorrow 10/7   Are you checking your feet? Yes    How many days per week are you checking your feet? 7      Dietary Intake   Breakfast egg, bagel, hummus, thyme; cereal and milk    Snack (morning) 1 snack/day - [place] fig bars, granaol bars, figs, apple, banana, pears, grapes    Lunch skips or eats [employer] sandwich with fries (  1 x week)    Dinner fish, lamb, chicken; potatoes, peas, beans, corn, rice, broccoli,cauliflower, egg plant, tomoatoes, lettuce, cuccumbers, carrots, brussels sprouts    Beverage(s) fruit juice, water, milk with coffee; unsweetened tea      Exercise   Exercise Type Light (walking / raking leaves)   elliptical and stretches   How many days per week to you exercise? 3    How many minutes per day do you exercise? 30    Total minutes per week of exercise 90      Patient Education   Previous Diabetes Education No    Disease state  Definition of diabetes, type 1 and 2, and the diagnosis of diabetes;Factors that contribute  to the development of diabetes    Nutrition management  Role of diet in the treatment of diabetes and the relationship between the three main macronutrients and blood glucose level;Food label reading, portion sizes and measuring food.;Reviewed blood glucose goals for pre and post meals and how to evaluate the patients' food intake on their blood glucose level.    Physical activity and exercise  Role of exercise on diabetes management, blood pressure control and cardiac health.    Medications Reviewed patients medication for diabetes, action, purpose, timing of dose and side effects.    Monitoring Purpose and frequency of SMBG.;Taught/discussed recording of test results and interpretation of SMBG.;Identified appropriate SMBG and/or A1C goals.   Instructed him to bring meter to class for instruction if he is not able to use at home.   Chronic complications Relationship between chronic complications and blood glucose control    Psychosocial adjustment Identified and addressed patients feelings and concerns about diabetes      Individualized Goals (developed by patient)   Reducing Risk Other (comment)   improve blood sugars, decrease medications, prevent diabetse complications, lead a healthier life, become more fit     Outcomes   Expected Outcomes Demonstrated interest in learning. Expect positive outcomes           Individualized Plan for Diabetes Self-Management Training:   Learning Objective:  Patient will have a greater understanding of diabetes self-management. Patient education plan is to attend individual and/or group sessions per assessed needs and concerns.   Plan:   Patient Instructions  Check blood sugars 1 x day before breakfast or 2 hrs after supper - 3 x week Bring blood sugar records to the next class Exercise: Continue elliptical and stretches for  30  minutes  3  days a week Eat 3 meals day,  1-2  snacks a day Space meals 4-6 hours apart Don't skip lunch Avoid sugar  sweetened drinks (juices) Call back to schedule classes   Expected Outcomes:  Demonstrated interest in learning. Expect positive outcomes  Education material provided:  General Meal Planning Guidelines Simple Meal Plan  If problems or questions, patient to contact team via:  Johny Drilling, RN, CCM, [place] [phone]  Future DSME appointment:  Patient to check with his wife and call back to schedule classes.

## 2019-11-27 NOTE — Patient Instructions (Signed)
Check blood sugars 1 x day before breakfast or 2 hrs after supper - 3 x week Bring blood sugar records to the next class  Exercise: Continue elliptical and stretches for  30  minutes  3  days a week  Eat 3 meals day,  1-2  snacks a day Space meals 4-6 hours apart Don't skip lunch Avoid sugar sweetened drinks (juices)  Return for classes on:  Call back to schedule classes

## 2019-12-10 ENCOUNTER — Telehealth: Payer: Self-pay | Admitting: *Deleted

## 2019-12-10 NOTE — Telephone Encounter (Signed)
Phone call to follow up with scheduling diabetes classes. Left message to call back.

## 2019-12-13 ENCOUNTER — Telehealth: Payer: Self-pay | Admitting: *Deleted

## 2019-12-13 NOTE — Telephone Encounter (Signed)
Received voice mail yesterday that patient will attend Diabetes classes beginning Dec 26, 2019.

## 2019-12-26 ENCOUNTER — Encounter: Payer: Medicare Other | Attending: Internal Medicine | Admitting: Dietician

## 2019-12-26 ENCOUNTER — Other Ambulatory Visit: Payer: Self-pay

## 2019-12-26 ENCOUNTER — Encounter: Payer: Self-pay | Admitting: Dietician

## 2019-12-26 VITALS — Ht 70.0 in | Wt 177.4 lb

## 2019-12-26 DIAGNOSIS — E119 Type 2 diabetes mellitus without complications: Secondary | ICD-10-CM | POA: Insufficient documentation

## 2019-12-26 NOTE — Progress Notes (Signed)
Appt. Start Time: 900 Appt. End Time: 1200  Class 1 Diabetes Overview - define DM; state own type of DM; identify functions of pancreas and insulin; define insulin deficiency vs insulin resistance  Psychosocial - identify DM as a source of stress; state the effects of stress on BG control  Nutritional Management - describe effects of food on blood glucose; identify sources of carbohydrate, protein and fat; verbalize the importance of balance meals in controlling blood glucose  Exercise - describe the effects of exercise on blood glucose and importance of regular exercise in controlling diabetes; state a plan for personal exercise; verbalize contraindications for exercise  Self-Monitoring - state importance of SMBG; use SMBG results to effectively manage diabetes; identify importance of regular HbA1C testing and goals for results  Acute Complications - recognize hyperglycemia and hypoglycemia with causes and effects; identify blood glucose results as high, low or in control; list steps in treating and preventing high and low blood glucose  Chronic Complications/Foot, Skin, Eye Dental Care - identify possible long-term complications of diabetes (retinopathy, neuropathy, nephropathy, cardiovascular disease, infections); state importance of daily self-foot exams; describe how to examine feet and what to look for; explain appropriate eye and dental care  Lifestyle Changes/Goals & Health/Community Resources - state benefits of making appropriate lifestyle changes; identify habits that need to change (meals, tobacco, alcohol); identify strategies to reduce risk factors for personal health  Pregnancy/Sexual Health - define gestational diabetes; state importance of good blood glucose control and birth control prior to pregnancy; state importance of good blood glucose control in preventing sexual problems (impotence, vaginal dryness, infections, loss of desire); state relationship of blood glucose control  and pregnancy outcome; describe risk of maternal and fetal complications  Teaching Materials Used: Class 1 Slides/Notebook Diabetes Booklet ID Card  Medic Alert/Medic ID Forms Sleep Evaluation Exercise Handout Planning a Balanced Meal Goals for Class 1

## 2020-01-02 ENCOUNTER — Other Ambulatory Visit: Payer: Self-pay

## 2020-01-02 ENCOUNTER — Encounter: Payer: Medicare Other | Admitting: *Deleted

## 2020-01-02 ENCOUNTER — Encounter: Payer: Self-pay | Admitting: *Deleted

## 2020-01-02 VITALS — Wt 176.0 lb

## 2020-01-02 DIAGNOSIS — E119 Type 2 diabetes mellitus without complications: Secondary | ICD-10-CM

## 2020-01-02 NOTE — Progress Notes (Signed)

## 2020-01-08 IMAGING — US US RENAL
1 series · 14 of 25 positions shown · non-contrast
Comparison: Renal ultrasound dated February 06, 2017.

CLINICAL DATA: Nephrolithiasis.

EXAM:
RENAL / URINARY TRACT ULTRASOUND COMPLETE

[Series 1: us renal · 0.25mm/px · 14 of 43 slices shown]
[im 1/43]
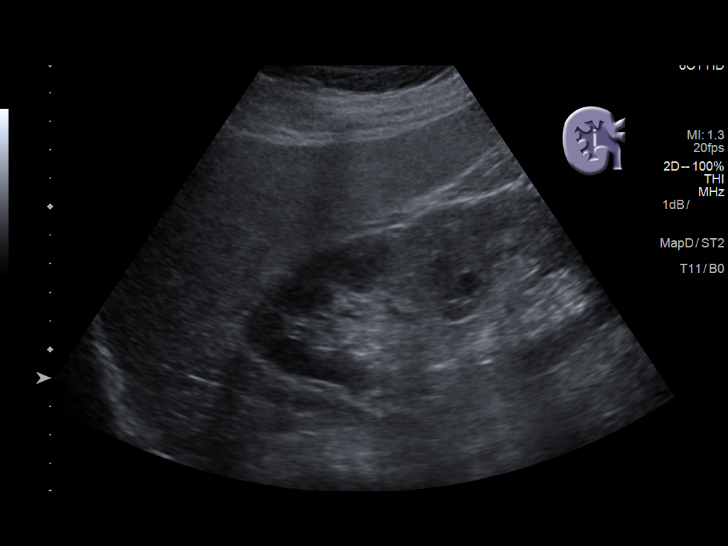
[im 4/43]
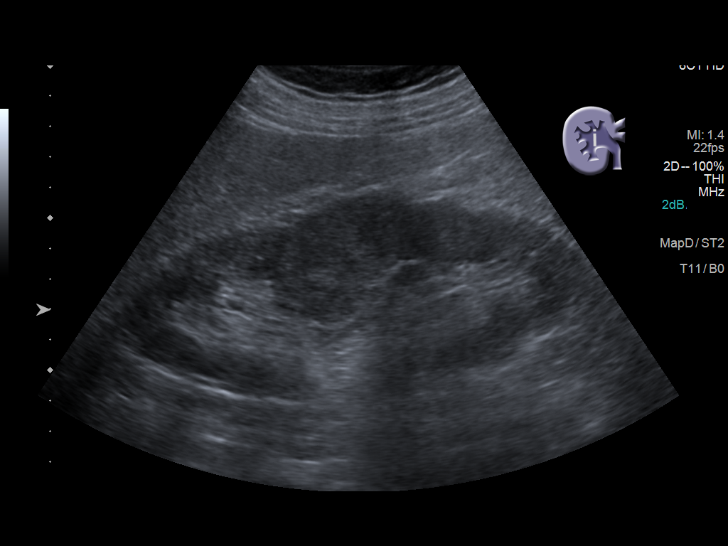
[im 8/43]
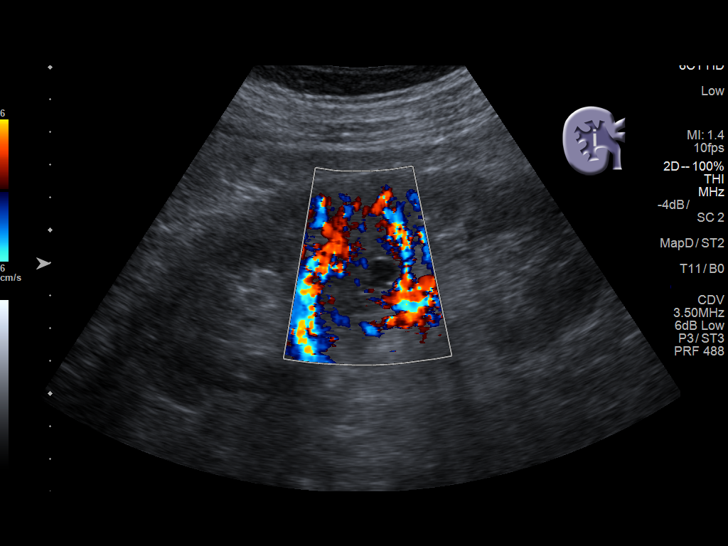
[im 11/43]
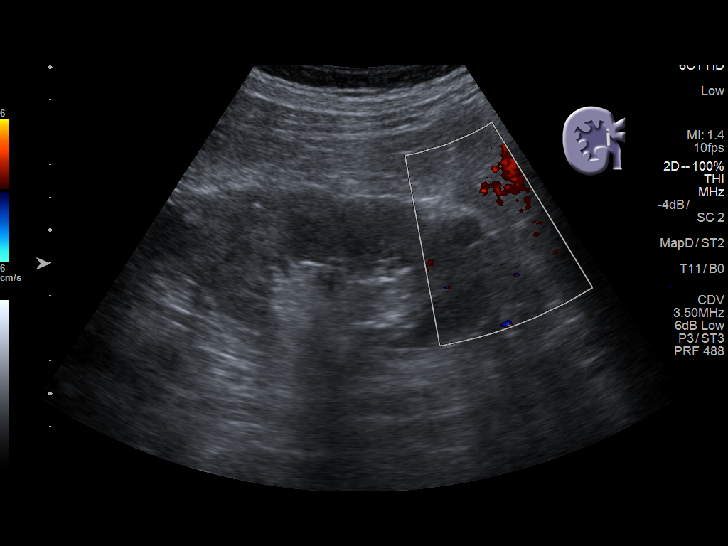
[im 15/43]
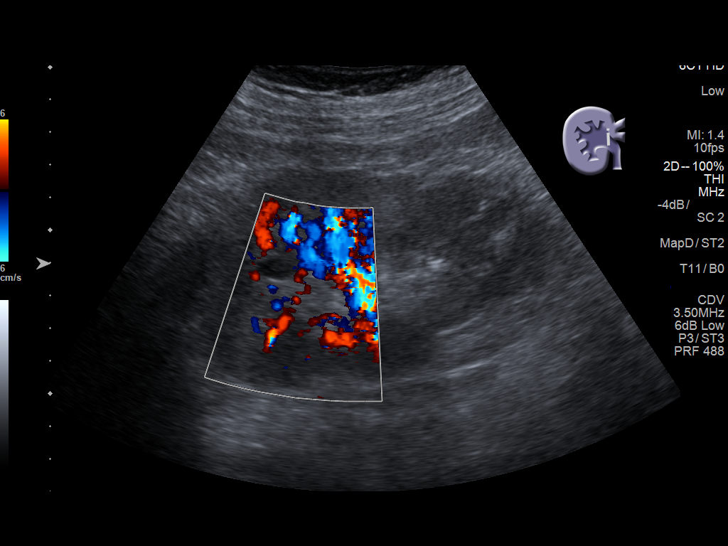
[im 16/43]
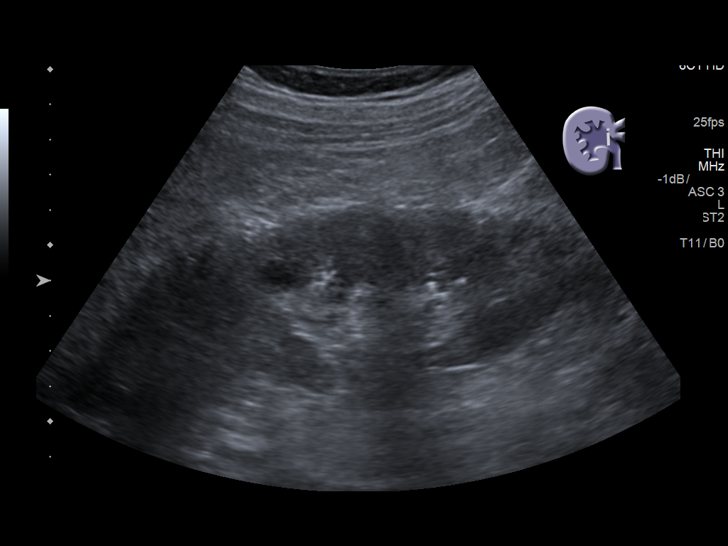
[im 20/43]
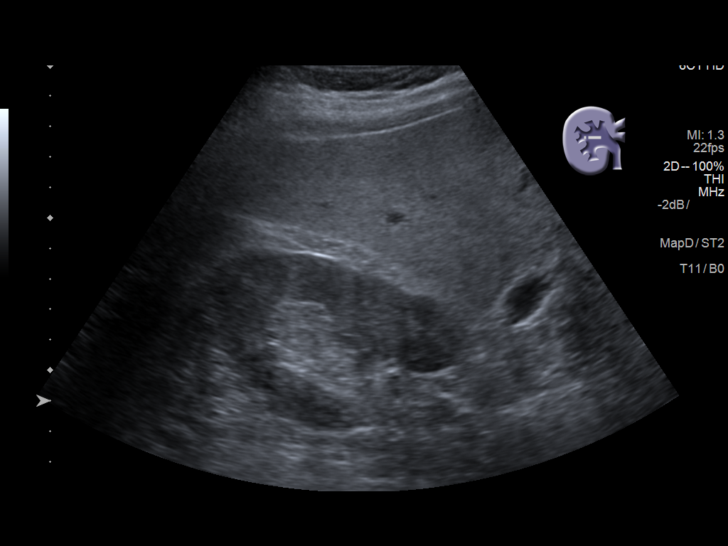
[im 23/43]
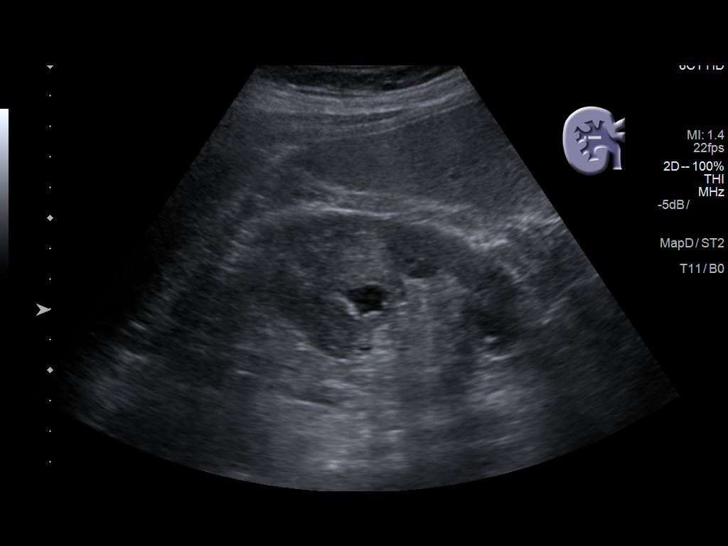
[im 27/43]
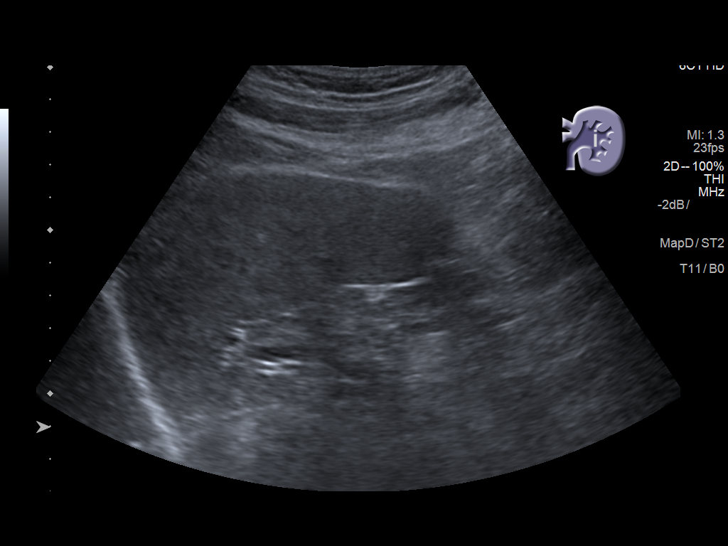
[im 29/43]
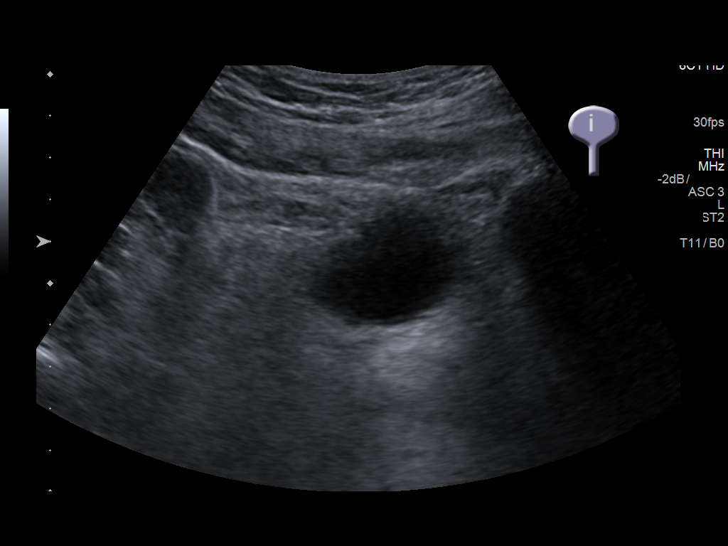
[im 32/43]
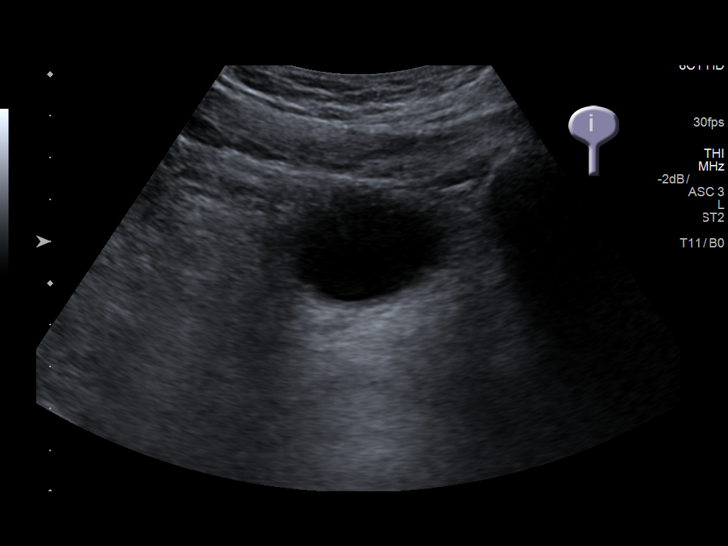
[im 36/43]
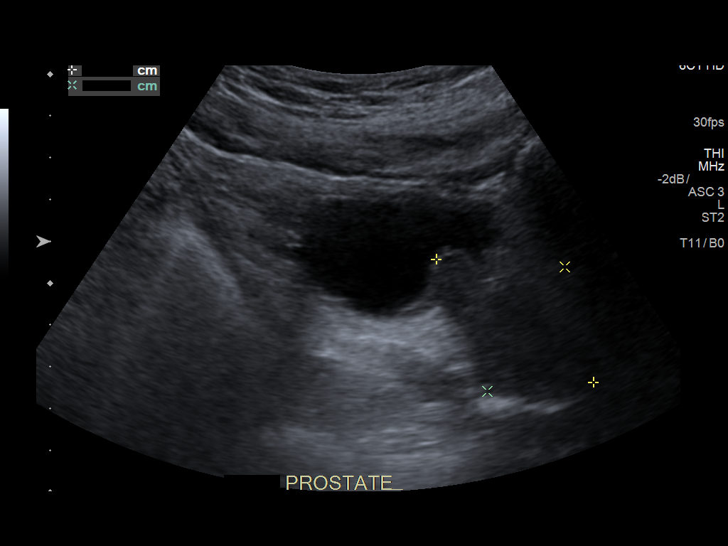
[im 39/43]
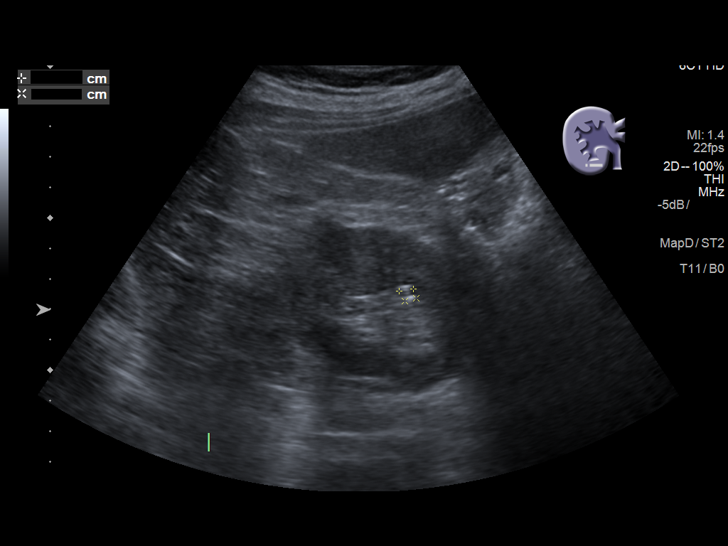
[im 43/43]
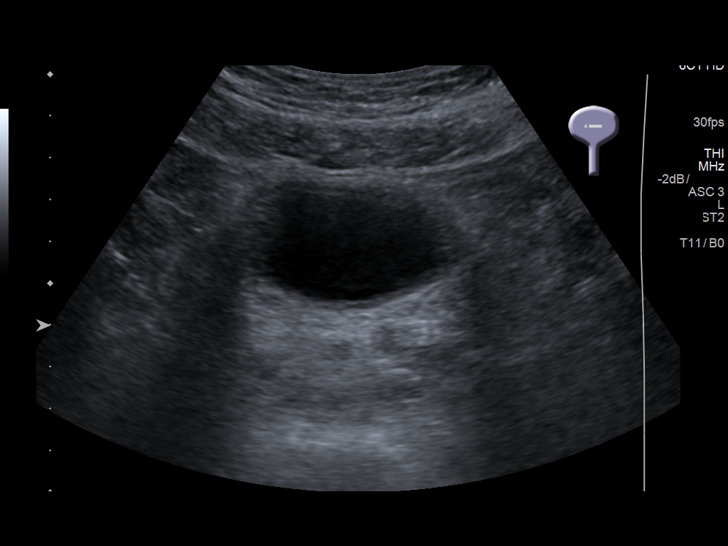

[14 of 25 positions shown; findings below may reference images not displayed]

FINDINGS: Right Kidney:

Length: 15.2 cm. Echogenicity within normal limits. No
hydronephrosis. Two small nonshadowing echogenic foci within the
lower pole measuring up to 4 mm, similar to prior study. Unchanged
simple cysts.

Left Kidney:

Surgically absent.

Bladder:

Enlarged prostate gland indenting the bladder base.
IMPRESSION: 1. Nonobstructing 4 mm right renal calculi, unchanged.

## 2020-01-09 ENCOUNTER — Encounter: Payer: Self-pay | Admitting: Dietician

## 2020-01-09 ENCOUNTER — Other Ambulatory Visit: Payer: Self-pay

## 2020-01-09 ENCOUNTER — Encounter: Payer: Medicare Other | Admitting: Dietician

## 2020-01-09 VITALS — BP 138/70 | Ht 70.0 in | Wt 176.3 lb

## 2020-01-09 DIAGNOSIS — E119 Type 2 diabetes mellitus without complications: Secondary | ICD-10-CM

## 2020-01-09 NOTE — Progress Notes (Signed)
Appt. Start Time: 900 Appt. End Time: 1200  Class 3 Diabetes Overview - identify functions of pancreas and insulin; define insulin deficiency vs insulin resistance  Medications - state name, dose, timing of currently prescribed medications; describe types of medications available for diabetes  Psychosocial - identify DM as a source of stress; state the effects of stress on BG control; verbalize appropriate stress management techniques; identify personal stress issues   Nutritional Management - use food labels to identify serving size, content of carbohydrate, fiber, protein, fat, saturated fat and sodium; recognize food sources of fat, saturated fat, trans fat, and sodium, and verbalize goals for intake; describe healthful, appropriate food choices when dining out   Exercise - state a plan for personal exercise; verbalize contraindications for exercise  Self-Monitoring - state importance of SMBG; use SMBG results to effectively manage diabetes; identify importance of regular HbA1C testing and goals for results  Acute Complications - recognize hyperglycemia and hypoglycemia with causes and effects; identify blood glucose results as high, low or in control; list steps in treating and preventing high and low blood glucose  Chronic Complications - state importance of daily self-foot exams; explain appropriate eye and dental care  Lifestyle Changes/Goals & Health/Community Resources - set goals for proper diabetes care; state need for and frequency of healthcare follow-up; describe appropriate community resources for good health (ADA, web sites, apps)   Teaching Materials Used: Class 3 Slide Packet Diabetes Stress Test Stress Management Tools Stress Poem Goal Setting Worksheet Website/App List

## 2020-01-14 ENCOUNTER — Encounter: Payer: Self-pay | Admitting: *Deleted

## 2020-04-07 ENCOUNTER — Emergency Department: Payer: Medicare HMO

## 2020-04-07 ENCOUNTER — Encounter: Payer: Self-pay | Admitting: Emergency Medicine

## 2020-04-07 ENCOUNTER — Emergency Department
Admission: EM | Admit: 2020-04-07 | Discharge: 2020-04-07 | Disposition: A | Discharge status: Home / Self Care | Payer: Medicare HMO | Attending: Emergency Medicine | Admitting: Emergency Medicine

## 2020-04-07 DIAGNOSIS — R0789 Other chest pain: Secondary | ICD-10-CM | POA: Diagnosis not present

## 2020-04-07 DIAGNOSIS — X58XXXA Exposure to other specified factors, initial encounter: Secondary | ICD-10-CM | POA: Insufficient documentation

## 2020-04-07 DIAGNOSIS — N189 Chronic kidney disease, unspecified: Secondary | ICD-10-CM | POA: Insufficient documentation

## 2020-04-07 DIAGNOSIS — I2510 Atherosclerotic heart disease of native coronary artery without angina pectoris: Secondary | ICD-10-CM | POA: Insufficient documentation

## 2020-04-07 DIAGNOSIS — Y9301 Activity, walking, marching and hiking: Secondary | ICD-10-CM | POA: Insufficient documentation

## 2020-04-07 DIAGNOSIS — E1122 Type 2 diabetes mellitus with diabetic chronic kidney disease: Secondary | ICD-10-CM | POA: Insufficient documentation

## 2020-04-07 DIAGNOSIS — S46902A Unspecified injury of unspecified muscle, fascia and tendon at shoulder and upper arm level, left arm, initial encounter: Secondary | ICD-10-CM | POA: Diagnosis present

## 2020-04-07 DIAGNOSIS — S46812A Strain of other muscles, fascia and tendons at shoulder and upper arm level, left arm, initial encounter: Secondary | ICD-10-CM | POA: Insufficient documentation

## 2020-04-07 DIAGNOSIS — Z955 Presence of coronary angioplasty implant and graft: Secondary | ICD-10-CM | POA: Insufficient documentation

## 2020-04-07 DIAGNOSIS — Z79899 Other long term (current) drug therapy: Secondary | ICD-10-CM | POA: Diagnosis not present

## 2020-04-07 DIAGNOSIS — Z7982 Long term (current) use of aspirin: Secondary | ICD-10-CM | POA: Insufficient documentation

## 2020-04-07 DIAGNOSIS — I129 Hypertensive chronic kidney disease with stage 1 through stage 4 chronic kidney disease, or unspecified chronic kidney disease: Secondary | ICD-10-CM | POA: Insufficient documentation

## 2020-04-07 LAB — CBC
HCT: 48.2 % (ref 39.0–52.0)
Hemoglobin: 16.1 g/dL (ref 13.0–17.0)
MCH: 26.1 pg (ref 26.0–34.0)
MCHC: 33.4 g/dL (ref 30.0–36.0)
MCV: 78.1 fL — ABNORMAL LOW (ref 80.0–100.0)
Platelets: 211 K/uL (ref 150–400)
RBC: 6.17 MIL/uL — ABNORMAL HIGH (ref 4.22–5.81)
RDW: 14.1 % (ref 11.5–15.5)
WBC: 6.2 K/uL (ref 4.0–10.5)
nRBC: 0 % (ref 0.0–0.2)

## 2020-04-07 LAB — BASIC METABOLIC PANEL
Anion gap: 11 (ref 5–15)
BUN: 20 mg/dL (ref 8–23)
CO2: 25 mmol/L (ref 22–32)
Calcium: 9.5 mg/dL (ref 8.9–10.3)
Chloride: 103 mmol/L (ref 98–111)
Creatinine, Ser: 0.94 mg/dL (ref 0.61–1.24)
GFR, Estimated: >60 mL/min (ref >60)
Glucose, Bld: 124 mg/dL — ABNORMAL HIGH (ref 70–99)
Potassium: 4 mmol/L (ref 3.5–5.1)
Sodium: 139 mmol/L (ref 135–145)

## 2020-04-07 LAB — TROPONIN I (HIGH SENSITIVITY)
Troponin I (High Sensitivity): 7 ng/L (ref <18)
Troponin I (High Sensitivity): 7 ng/L (ref <18)

## 2020-04-07 MED ORDER — KETOROLAC TROMETHAMINE 30 MG/ML IJ SOLN
15.0000 mg | Freq: Once | INTRAMUSCULAR | Status: AC
  Administered 2020-04-07: 15 mg via INTRAVENOUS
  Filled 2020-04-07: qty 1

## 2020-04-07 MED ORDER — ACETAMINOPHEN 500 MG PO TABS
1000.0000 mg | ORAL_TABLET | Freq: Once | ORAL | Status: AC
  Administered 2020-04-07: 1000 mg via ORAL
  Filled 2020-04-07: qty 2

## 2020-04-07 MED ORDER — LIDOCAINE HCL (PF) 1 % IJ SOLN
10.0000 mL | Freq: Once | INTRAMUSCULAR | Status: AC
  Administered 2020-04-07: 10 mL
  Filled 2020-04-07: qty 10

## 2020-04-07 NOTE — Discharge Instructions (Signed)
Use Tylenol for pain and fevers.  Up to 1000 mg per dose, up to 4 times per day.  Do not take more than 4000 mg of Tylenol/acetaminophen within 24 hours..  

## 2020-04-07 NOTE — ED Triage Notes (Signed)
Pt in w/[facility] since last night - states it got worse when waking this am. Hx of 2 MI's w/stent placement. Took daily 81mg  ASA this morning, no NTG PTA. Endorses mild sob, no n/v. Pain currently 8/10

## 2020-04-07 NOTE — ED Provider Notes (Signed)
[facility] Emergency Department Provider Note ____________________________________________   Event Date/Time   First MD Initiated Contact with Patient 04/07/20 1715     (approximate)  I have reviewed the triage vital signs and the nursing notes.  HISTORY  Chief Complaint Chest Pain   HPI Zachary Gordon is a 70 y.o. malewho presents to the ED for evaluation of chest pain.   Chart review indicates history of HTN, HLD, DM and CAD. 01/2017 admission for an NSTEMI, LHC without PCI or DES placement.  Last stented in 2011 to the LAD. Patient reports seeing his cardiologist, Dr. Humphrey Rolls, about 1 month ago where he had an outpatient treadmill stress test that was "normal."  I cannot see the result of this test in epic.  Patient reports developing left-sided posterior shoulder pain while seated yesterday evening, that progressed anteriorly over his shoulder to his left-sided chest last night and has been constant and present since that time.  He reports 4-5/10 tight aching sensation to his left-sided chest and left-sided shoulder that is constant and worse with palpation.  Denies any associated nausea, diaphoresis, emesis, fever, shortness of breath, syncope, trauma.   Past Medical History:  Diagnosis Date  . Chronic kidney disease    stones  . Coronary artery disease    heart stent  . Diabetes mellitus without complication ([place])   . Gout   . Heart disease   . Heart murmur   . History of kidney stones   . History of nephrectomy   . Hyperlipidemia   . Hypertension   . Myocardial infarction [facility])     Patient Active Problem List   Diagnosis Date Noted  . NSTEMI (non-ST elevated myocardial infarction) ([place]) 01/23/2017    Past Surgical History:  Procedure Laterality Date  . COLONOSCOPY WITH PROPOFOL N/A 01/10/2017   Procedure: COLONOSCOPY WITH PROPOFOL;  Surgeon: Toledo, Benay Pike, MD;  Location: [facility] ENDOSCOPY;  Service: Gastroenterology;  Laterality: N/A;   . COLONOSCOPY WITH PROPOFOL N/A 02/20/2018   Procedure: COLONOSCOPY WITH PROPOFOL;  Surgeon: Toledo, Benay Pike, MD;  Location: [facility] ENDOSCOPY;  Service: Gastroenterology;  Laterality: N/A;  . CORONARY ANGIOPLASTY WITH STENT PLACEMENT    . EYE SURGERY    . LEFT HEART CATH AND CORONARY ANGIOGRAPHY Right 01/24/2017   Procedure: LEFT HEART CATH AND CORONARY ANGIOGRAPHY;  Surgeon: Dionisio David, MD;  Location: [facility];  Service: Cardiovascular;  Laterality: Right;  . NEPHRECTOMY Left     Prior to Admission medications   Medication Sig Start Date End Date Taking? Authorizing Provider  amLODipine (NORVASC) 5 MG tablet Take 5 mg by mouth daily.  06/08/16   [provider]  aspirin EC 81 MG tablet Take 1 tablet (81 mg total) by mouth daily. 01/24/17   Henreitta Leber, MD  carvedilol (COREG) 6.25 MG tablet Take 6.25 mg by mouth daily.  06/08/16   [provider]  celecoxib (CELEBREX) 200 MG capsule TAKE 1 TO 2 CAPSULES BY MOUTH ONCE DAILY AS NEEDED 08/15/17   [provider]  clopidogrel (PLAVIX) 75 MG tablet Take 1 tablet (75 mg total) by mouth daily. 01/24/17   Henreitta Leber, MD  Colchicine 0.6 MG CAPS Take 1 capsule by mouth daily as needed.  11/21/16   [provider]  Evolocumab (REPATHA SURECLICK [place]) Inject 833 mg into the skin every 14 (fourteen) days.    [provider]  isosorbide mononitrate (IMDUR) 30 MG 24 hr tablet Take 30 mg by mouth  daily.  07/31/17   [provider]  lisinopril (ZESTRIL) 20 MG tablet Take 20 mg by mouth daily. 06/19/19   [provider]  rosuvastatin (CRESTOR) 40 MG tablet Take 40 mg by mouth daily. 10/26/19   [provider]    Allergies Penicillins  Family History  Problem Relation Age of Onset  . Prostate cancer Neg Hx   . Bladder Cancer Neg Hx   . Kidney cancer Neg Hx     Social History Social History   Tobacco Use  . Smoking status: Never Smoker  . Smokeless tobacco:  Never Used  Vaping Use  . Vaping Use: Former  Substance Use Topics  . Alcohol use: Not Currently  . Drug use: Not Currently    Review of Systems  Constitutional: No fever/chills Eyes: No visual changes. ENT: No sore throat. Cardiovascular: Positive for chest pain Respiratory: Denies shortness of breath. Gastrointestinal: No abdominal pain.  No nausea, no vomiting.  No diarrhea.  No constipation. Genitourinary: Negative for dysuria. Musculoskeletal: Positive for left trapezius and left-sided chest wall pain. Skin: Negative for rash. Neurological: Negative for headaches, focal weakness or numbness.  ____________________________________________   PHYSICAL EXAM:  VITAL SIGNS: Vitals:   04/07/20 1730 04/07/20 1800  BP: (!) 144/86 129/79  Pulse: 69 66  Resp: 16 16  Temp:    SpO2: 99% 97%     Constitutional: Alert and oriented. Well appearing and in no acute distress.  Pleasant and conversational in full sentences. Eyes: Conjunctivae are normal. PERRL. EOMI. Head: Atraumatic. Nose: No congestion/rhinnorhea. Mouth/Throat: Mucous membranes are moist.  Oropharynx non-erythematous. Neck: No stridor. No cervical spine tenderness to palpation. Cardiovascular: Normal rate, regular rhythm. Grossly normal heart sounds.  Good peripheral circulation. Respiratory: Normal respiratory effort.  No retractions. Lungs CTAB. Gastrointestinal: Soft , nondistended, nontender to palpation. No CVA tenderness. Musculoskeletal: No lower extremity tenderness nor edema.  No joint effusions. No signs of acute trauma. Tenderness to palpation of his left trapezius muscle without overlying skin changes, signs of trauma.  Palpable muscular cord is noted Similar tenderness over his left-sided pectoralis musculature without overlying skin changes.  This reproduces his symptoms. Neurologic:  Normal speech and language. No gross focal neurologic deficits are appreciated. No gait instability noted. Cranial  nerves II through XII intact 5/5 strength and sensation in all 4 extremities Skin:  Skin is warm, dry and intact. No rash noted. Psychiatric: Mood and affect are normal. Speech and behavior are normal. ____________________________________________   LABS (all labs ordered are listed, but only abnormal results are displayed)  Labs Reviewed  BASIC METABOLIC PANEL - Abnormal; Notable for the following components:      Result Value   Glucose, Bld 124 (*)    All other components within normal limits  CBC - Abnormal; Notable for the following components:   RBC 6.17 (*)    MCV 78.1 (*)    All other components within normal limits  TROPONIN I (HIGH SENSITIVITY)  TROPONIN I (HIGH SENSITIVITY)   ____________________________________________  12 Lead EKG  Sinus rhythm, rate of 71 bpm.  Leftward axis.  Normal intervals.  No evidence of acute ischemia.  Similar to EKG performed in 2018 for comparison. ____________________________________________  RADIOLOGY  ED MD interpretation: Two view CXR reviewed by me without evidence of acute cardiopulmonary pathology.  Official radiology report(s): DG Chest 2 View  Result Date: 04/07/2020 CLINICAL DATA:  Left-sided chest pain radiating to left arm, history of myocardial infarction EXAM: CHEST - 2 VIEW COMPARISON:  01/23/2017  FINDINGS: Frontal and lateral views of the chest demonstrate an unremarkable cardiac silhouette. No airspace disease, effusion, or pneumothorax. No acute bony abnormalities. IMPRESSION: 1. No acute intrathoracic process. Electronically Signed   By: Randa Ngo M.D.   On: 04/07/2020 16:05    ____________________________________________   PROCEDURES and INTERVENTIONS  Procedure(s) performed (including Critical Care):  .1-3 Lead EKG Interpretation Performed by: Vladimir Crofts, MD Authorized by: Vladimir Crofts, MD     Interpretation: normal     ECG rate:  70   ECG rate assessment: normal     Rhythm: sinus rhythm      Ectopy: none     Conduction: normal     Trigger point injection After discussing risks versus benefits, patient is agreeable to proceed with trigger point injection for treatment of acute muscular spasm. Risks discussed include worsening pain, infection, neurovascular damage and pneumothorax.  Point of maximal tenderness was identified at left trapezius muscle. Overlying skin was cleaned with alcohol swabs. 1% lidocaine without epinephrine was incrementally injected, after confirming negative drawback, into this musculature. Total of 4 milliliters injected. Well-tolerated without any apparent complications.   Medications  lidocaine (PF) (XYLOCAINE) 1 % injection 10 mL (has no administration in time range)  acetaminophen (TYLENOL) tablet 1,000 mg (1,000 mg Oral Given 04/07/20 1738)  ketorolac (TORADOL) 30 MG/ML injection 15 mg (15 mg Intravenous Given 04/07/20 1738)    ____________________________________________   MDM / ED COURSE   70 year old male with known CAD presents to the ED with left-sided chest and trapezius pain, most consistent with muscular strain/spasm, and ultimately amenable to outpatient management.  Normal vitals on room air.  Exam with stigmata of muscular spasm to his trapezius muscle and left pectoralis muscle without overlying skin changes or signs of trauma.  He is in no distress, has no evidence of neurovascular deficits or trauma.  He looks well overall.  Work-up is reassuring without evidence of ACS, pneumonia or any electrolyte derangements.  Resolution of chest pain after one-time Toradol administration and resolution of trapezius pain after trigger point injection.  I see no evidence of cardiac pathology or any pathology to preclude outpatient management.  He is pain-free.  We discussed return precautions for the ED prior to discharge.   Clinical Course as of 04/07/20 1821  Tue Apr 07, 2020  1757 Trigger point junction performed of his left trapezius  musculature.  Patient reports resolution of his chest pain after Toradol administration and therefore declines trigger point injection to his left pectoralis musculature. [DS]  6237 Reassessed.  Patient reports resolution of all symptoms and pain.  He reports feeling well.  We discussed reassuring cardiac work-up.  He reports he is eager to go home.  Things is reasonable.  We discussed return precautions for the ED and following up with his PCP and [DS]  [place]  cardiologist. [DS]    Clinical Course User Index [DS] Vladimir Crofts, MD    ____________________________________________   FINAL CLINICAL IMPRESSION(S) / ED DIAGNOSES  Final diagnoses:  Other chest pain  Strain of left trapezius muscle, initial encounter     ED Discharge Orders    None       [facility]   Note:  This document was prepared using Dragon voice recognition software and may include unintentional dictation errors.   Vladimir Crofts, MD 04/07/20 [place]

## 2020-07-23 ENCOUNTER — Other Ambulatory Visit: Payer: Self-pay

## 2020-07-23 ENCOUNTER — Ambulatory Visit (INDEPENDENT_AMBULATORY_CARE_PROVIDER_SITE_OTHER): Payer: Medicare HMO | Admitting: Urology

## 2020-07-23 DIAGNOSIS — N401 Enlarged prostate with lower urinary tract symptoms: Secondary | ICD-10-CM | POA: Diagnosis not present

## 2020-07-23 DIAGNOSIS — N400 Enlarged prostate without lower urinary tract symptoms: Secondary | ICD-10-CM | POA: Diagnosis not present

## 2020-07-23 DIAGNOSIS — R3912 Poor urinary stream: Secondary | ICD-10-CM

## 2020-07-23 LAB — BLADDER SCAN AMB NON-IMAGING: Scan Result: 16

## 2020-07-23 NOTE — Patient Instructions (Addendum)
Cystoscopy Cystoscopy is a procedure that is used to help diagnose and sometimes treat conditions that affect the lower urinary tract. The lower urinary tract includes the bladder and the urethra. The urethra is the tube that drains urine from the bladder. Cystoscopy is done using a thin, tube-shaped instrument with a light and camera at the end (cystoscope). The cystoscope may be hard or flexible, depending on the goal of the procedure. The cystoscope is inserted through the urethra, into the bladder. Cystoscopy may be recommended if you have:  Urinary tract infections that keep coming back.  Blood in the urine (hematuria).  An inability to control when you urinate (urinary incontinence) or an overactive bladder.  Unusual cells found in a urine sample.  A blockage in the urethra, such as a urinary stone.  Painful urination.  An abnormality in the bladder found during an intravenous pyelogram (IVP) or CT scan. Cystoscopy may also be done to remove a sample of tissue to be examined under a microscope (biopsy). What are the risks? Generally, this is a safe procedure. However, problems may occur, including:  Infection.  Bleeding.  What happens during the procedure?  1. You will be given one or more of the following: ? A medicine to numb the area (local anesthetic). 2. The area around the opening of your urethra will be cleaned. 3. The cystoscope will be passed through your urethra into your bladder. 4. Germ-free (sterile) fluid will flow through the cystoscope to fill your bladder. The fluid will stretch your bladder so that your health care provider can clearly examine your bladder walls. 5. Your doctor will look at the urethra and bladder. 6. The cystoscope will be removed The procedure may vary among health care providers  What can I expect after the procedure? After the procedure, it is common to have: 1. Some soreness or pain in your abdomen and urethra. 2. Urinary symptoms.  These include: ? Mild pain or burning when you urinate. Pain should stop within a few minutes after you urinate. This may last for up to 1 week. ? A small amount of blood in your urine for several days. ? Feeling like you need to urinate but producing only a small amount of urine. Follow these instructions at home: General instructions  Return to your normal activities as told by your health care provider.   Do not drive for 24 hours if you were given a sedative during your procedure.  Watch for any blood in your urine. If the amount of blood in your urine increases, call your health care provider.  If a tissue sample was removed for testing (biopsy) during your procedure, it is up to you to get your test results. Ask your health care provider, or the department that is doing the test, when your results will be ready.  Drink enough fluid to keep your urine pale yellow.  Keep all follow-up visits as told by your health care provider. This is important. Contact a health care provider if you:  Have pain that gets worse or does not get better with medicine, especially pain when you urinate.  Have trouble urinating.  Have more blood in your urine. Get help right away if you:  Have blood clots in your urine.  Have abdominal pain.  Have a fever or chills.  Are unable to urinate. Summary  Cystoscopy is a procedure that is used to help diagnose and sometimes treat conditions that affect the lower urinary tract.  Cystoscopy is done using   a thin, tube-shaped instrument with a light and camera at the end.  After the procedure, it is common to have some soreness or pain in your abdomen and urethra.  Watch for any blood in your urine. If the amount of blood in your urine increases, call your health care provider.  If you were prescribed an antibiotic medicine, take it as told by your health care provider. Do not stop taking the antibiotic even if you start to feel better. This  information is not intended to replace advice given to you by your health care provider. Make sure you discuss any questions you have with your health care provider. Document Revised: 01/30/2018 Document Reviewed: 01/30/2018 Elsevier Patient Education  [place].      Transrectal Ultrasound A transrectal ultrasound is a procedure that uses sound waves to create images of the prostate gland and nearby tissues. For this procedure, an ultrasound probe is placed in the rectum. The probe sends sound waves through the wall of the rectum into the prostate gland, which is located in front of the rectum. The images show the size and shape of the prostate gland and nearby structures. You may need this test if you have:  Trouble urinating.  Trouble getting your partner pregnant (infertility).  An abnormal result from a prostate screening exam. Tell a health care provider about:  Any allergies you have.  All medicines you are taking, including vitamins, herbs, eye drops, creams, and over-the-counter medicines.  Any blood disorders you have.  Any medical conditions you have.  Any surgeries you have had. What are the risks? Generally, this is a safe procedure. However, problems may occur, including:  Discomfort during the procedure. This is rare.  Blood in your urine or sperm after the procedure. This may occur if a sample of tissue (biopsy) is taken during the procedure. What happens before the procedure?  Your health care provider may instruct you to use an enema 1-4 hours before the procedure. Follow instructions from your health care provider about how to do the enema.  Ask your health care provider about: ? Changing or stopping your regular medicines. This is especially important if you are taking diabetes medicines or blood thinners. ? Taking medicines such as aspirin and ibuprofen. These medicines can thin your blood. Do not take these medicines unless your health care provider  tells you to take them. ? Taking over-the-counter medicines, vitamins, herbs, and supplements. What happens during the procedure?  You will be asked to lie down on your left side on an exam table.  You will bend your knees toward your chest.  Gel will be put on a probe, and the probe will be gently inserted into your rectum. This may cause a feeling of fullness.  The probe will send signals to a computer that will create images. These will be displayed on a monitor that looks like a small television screen.  The technician will slightly rotate the probe throughout the procedure. While rotating the probe, he or she will view and capture images of the prostate gland and the surrounding structures from different angles.  Your health care provider may take a biopsy sample of prostate tissue during the procedure. The images captured from the ultrasound will help guide the needle used to remove a sample of tissue. The sample will be sent to a lab for testing.  The probe will be removed. The procedure may vary among health care providers and hospitals. What can I expect after the  procedure?  It is up to you to get the results of your procedure. Ask your health care provider, or the department that is doing the procedure, when your results will be ready.  Keep all follow-up visits as told by your health care provider. This is important. Summary  A transrectal ultrasound is a procedure that uses sound waves to create images of the prostate gland and nearby tissues.  The images show the size and shape of the prostate gland and nearby structures.  Before the procedure, ask your health care provider about changing or stopping your regular medicines. This is especially important if you are taking diabetes medicines or blood thinners. This information is not intended to replace advice given to you by your health care provider. Make sure you discuss any questions you have with your health care  provider. Document Revised: 04/03/2019 Document Reviewed: 04/03/2019 Elsevier Patient Education  2021 [employer].

## 2020-07-24 ENCOUNTER — Telehealth: Payer: Self-pay

## 2020-07-24 LAB — URINALYSIS, COMPLETE
Bilirubin, UA: NEGATIVE
Glucose, UA: NEGATIVE
Leukocytes,UA: NEGATIVE
Nitrite, UA: NEGATIVE
Protein,UA: NEGATIVE
RBC, UA: NEGATIVE
Specific Gravity, UA: >1.03 — ABNORMAL HIGH (ref 1.005–1.030)
Urobilinogen, Ur: 1 mg/dL (ref 0.2–1.0)
pH, UA: 5.5 (ref 5.0–7.5)

## 2020-07-24 LAB — PSA: Prostate Specific Ag, Serum: 1.4 ng/mL (ref 0.0–4.0)

## 2020-07-24 LAB — MICROSCOPIC EXAMINATION: Bacteria, UA: NONE SEEN

## 2020-07-24 NOTE — Telephone Encounter (Signed)
-----   Message from Hollice Espy, MD sent at 07/24/2020  1:15 PM EDT ----- PSA is low, 1.4

## 2020-07-27 NOTE — Progress Notes (Signed)
07/23/2020 5:40 PM   Zachary Gordon 10-27-1950 [id number]  Referring provider: Jodi Marble, MD [place],  [place] [address]  Chief Complaint  Patient presents with  . Benign Prostatic Hypertrophy    HPI: 70 year old male who presents today for further evaluation/management of poorly controlled BPH.  IPSS as below.  He has no personal history of prostamegaly and obstructive urinary symptoms.  Due to concerns for preservation of ejaculatory function as well as sexual function, he is no longer in any BPH medications.  He is currently continue to try to conceive with his wife.  They underwent several rounds of IVF 1 of which ended in a pregnancy loss.  They are continuing cycles of this.  Notably, he has incidental history of left nephrectomy secondary to iatrogenic injury.  He also has a personal history of kidney stones but not had issues with this recently.    IPSS    Row Name 07/23/20 1100         International Prostate Symptom Score   How often have you had the sensation of not emptying your bladder? Almost always     How often have you had to urinate less than every two hours? Almost always     How often have you found you stopped and started again several times when you urinated? Almost always     How often have you found it difficult to postpone urination? More than half the time     How often have you had a weak urinary stream? Almost always     How often have you had to strain to start urination? Almost always     How many times did you typically get up at night to urinate? 4 Times     Total IPSS Score 33           Quality of Life due to urinary symptoms   If you were to spend the rest of your life with your urinary condition just the way it is now how would you feel about that? Terrible            Score:  1-7 Mild 8-19 Moderate 20-35 Severe    PMH: Past Medical History:  Diagnosis Date  . Chronic kidney disease    stones  .  Coronary artery disease    heart stent  . Diabetes mellitus without complication ([place])   . Gout   . Heart disease   . Heart murmur   . History of kidney stones   . History of nephrectomy   . Hyperlipidemia   . Hypertension   . Myocardial infarction [facility])     Surgical History: Past Surgical History:  Procedure Laterality Date  . COLONOSCOPY WITH PROPOFOL N/A 01/10/2017   Procedure: COLONOSCOPY WITH PROPOFOL;  Surgeon: Toledo, Benay Pike, MD;  Location: [facility] ENDOSCOPY;  Service: Gastroenterology;  Laterality: N/A;  . COLONOSCOPY WITH PROPOFOL N/A 02/20/2018   Procedure: COLONOSCOPY WITH PROPOFOL;  Surgeon: Toledo, Benay Pike, MD;  Location: [facility] ENDOSCOPY;  Service: Gastroenterology;  Laterality: N/A;  . CORONARY ANGIOPLASTY WITH STENT PLACEMENT    . EYE SURGERY    . LEFT HEART CATH AND CORONARY ANGIOGRAPHY Right 01/24/2017   Procedure: LEFT HEART CATH AND CORONARY ANGIOGRAPHY;  Surgeon: Dionisio David, MD;  Location: [place] CV LAB;  Service: Cardiovascular;  Laterality: Right;  . NEPHRECTOMY Left     Home Medications:  Allergies as of 07/23/2020      Reactions   Penicillins Rash  Has patient had a PCN reaction causing immediate rash, facial/tongue/throat swelling, SOB or lightheadedness with hypotension: Yes Has patient had a PCN reaction causing severe rash involving mucus membranes or skin necrosis: No Has patient had a PCN reaction that required hospitalization: No Has patient had a PCN reaction occurring within the last 10 years: No If all of the above answers are "NO", then may proceed with Cephalosporin use.      Medication List       Accurate as of July 23, 2020 11:59 PM. If you have any questions, ask your nurse or doctor.        amLODipine 5 MG tablet Commonly known as: NORVASC Take 5 mg by mouth daily.   aspirin EC 81 MG tablet Take 1 tablet (81 mg total) by mouth daily.   carvedilol 6.25 MG tablet Commonly known as: COREG Take 6.25 mg by mouth  daily.   celecoxib 200 MG capsule Commonly known as: CELEBREX TAKE 1 TO 2 CAPSULES BY MOUTH ONCE DAILY AS NEEDED   clopidogrel 75 MG tablet Commonly known as: PLAVIX Take 1 tablet (75 mg total) by mouth daily.   Colchicine 0.6 MG Caps Take 1 capsule by mouth daily as needed.   isosorbide mononitrate 30 MG 24 hr tablet Commonly known as: IMDUR Take 30 mg by mouth daily.   lisinopril 20 MG tablet Commonly known as: ZESTRIL Take 20 mg by mouth daily.   metFORMIN 500 MG tablet Commonly known as: GLUCOPHAGE Take 1 tablet by mouth 2 (two) times daily.   Repatha SureClick 660 MG/ML Soaj Generic drug: Evolocumab Inject into the skin. What changed: Another medication with the same name was removed. Continue taking this medication, and follow the directions you see here. Changed by: Hollice Espy, MD   rosuvastatin 40 MG tablet Commonly known as: CRESTOR Take 40 mg by mouth daily.       Allergies:  Allergies  Allergen Reactions  . Penicillins Rash    Has patient had a PCN reaction causing immediate rash, facial/tongue/throat swelling, SOB or lightheadedness with hypotension: Yes Has patient had a PCN reaction causing severe rash involving mucus membranes or skin necrosis: No Has patient had a PCN reaction that required hospitalization: No Has patient had a PCN reaction occurring within the last 10 years: No If all of the above answers are "NO", then may proceed with Cephalosporin use.     Family History: Family History  Problem Relation Age of Onset  . Prostate cancer Neg Hx   . Bladder Cancer Neg Hx   . Kidney cancer Neg Hx     Social History:  reports that he has never smoked. He has never used smokeless tobacco. He reports previous alcohol use. He reports previous drug use.   Physical Exam: Constitutional:  Alert and oriented, No acute distress. HEENT: [place] AT, moist mucus membranes.  Trachea midline, no masses. Cardiovascular: No clubbing, cyanosis, or  edema. Respiratory: Normal respiratory effort, no increased work of breathing. Rectal: Normal sphincter tone.  50 cc prostate, rubbery lateral lobes, nontender without nodules Skin: No rashes, bruises or suspicious lesions. Neurologic: Grossly intact, no focal deficits, moving all 4 extremities. Psychiatric: Normal mood and affect.  Laboratory Data: Lab Results  Component Value Date   WBC 6.2 04/07/2020   HGB 16.1 04/07/2020   HCT 48.2 04/07/2020   MCV 78.1 (L) 04/07/2020   PLT 211 04/07/2020    Lab Results  Component Value Date   CREATININE 0.94 04/07/2020    Lab Results  Component Value Date   HGBA1C 6.3 (H) 01/23/2017    Urinalysis Results for orders placed or performed in visit on 07/23/20  Microscopic Examination   Urine  Result Value Ref Range   WBC, UA 0-5 0 - 5 /hpf   RBC 0-2 0 - 2 /hpf   Epithelial Cells (non renal) 0-10 0 - 10 /hpf   Casts Present (A) None seen /lpf   Cast Type Hyaline casts N/A   Mucus, UA Present (A) Not Estab.   Bacteria, UA None seen None seen/Few  Urinalysis, Complete  Result Value Ref Range   Specific Gravity, UA >1.030 (H) 1.005 - 1.030   pH, UA 5.5 5.0 - 7.5   Color, UA Orange Yellow   Appearance Ur Hazy (A) Clear   Leukocytes,UA Negative Negative   Protein,UA Negative Negative/Trace   Glucose, UA Negative Negative   Ketones, UA Trace (A) Negative   RBC, UA Negative Negative   Bilirubin, UA Negative Negative   Urobilinogen, Ur 1.0 0.2 - 1.0 mg/dL   Nitrite, UA Negative Negative   Microscopic Examination See below:   PSA  Result Value Ref Range   Prostate Specific Ag, Serum 1.4 0.0 - 4.0 ng/mL  BLADDER SCAN AMB NON-IMAGING  Result Value Ref Range   Scan Result 16      Assessment & Plan:     1. Benign prostatic hyperplasia with weak urinary stream Lengthy discussion today about poorly controlled urinary symptoms  Given his concerns for preserving ejaculatory and erectile function while trying to conceive a  pregnancy, unable to tolerate alpha blockers due issues with retrograde ejaculation and sexual issues related to finasteride  PSA screening was updated today, PSA previously low and rectal exams unremarkable  No evidence of infection is contributing factor  We had a lengthy discussion today about alternative management options.  Destructive intervention such as holep, laser ablation, steam, or TURP may result in ejaculatory dysfunction.  We did discuss the option of UroLift and he was given literature about this today.  He is agreed to proceed with cystoscopy, transrectal ultrasound sizing to assess whether or not he is a candidate for this.  All questions answered today. - Urinalysis, Complete - BLADDER SCAN AMB NON-IMAGING - PSA; Future - PSA   Hollice Espy, MD  [facility] [address] [place], [place] [address] [phone]  I spent 31 total minutes on the day of the encounter including pre-visit review of the medical record, face-to-face time with the patient, and post visit ordering of labs/imaging/tests.

## 2020-07-29 ENCOUNTER — Encounter: Payer: Self-pay | Admitting: *Deleted

## 2020-08-26 ENCOUNTER — Ambulatory Visit (INDEPENDENT_AMBULATORY_CARE_PROVIDER_SITE_OTHER): Payer: Medicare HMO | Admitting: Urology

## 2020-08-26 ENCOUNTER — Other Ambulatory Visit: Payer: Self-pay

## 2020-08-26 ENCOUNTER — Encounter: Payer: Self-pay | Admitting: Urology

## 2020-08-26 VITALS — BP 126/70 | HR 60 | Ht 70.0 in | Wt 176.0 lb

## 2020-08-26 DIAGNOSIS — N401 Enlarged prostate with lower urinary tract symptoms: Secondary | ICD-10-CM | POA: Diagnosis not present

## 2020-08-26 DIAGNOSIS — R3912 Poor urinary stream: Secondary | ICD-10-CM | POA: Diagnosis not present

## 2020-08-26 LAB — URINALYSIS, COMPLETE
Bilirubin, UA: NEGATIVE
Glucose, UA: NEGATIVE
Ketones, UA: NEGATIVE
Leukocytes,UA: NEGATIVE
Nitrite, UA: NEGATIVE
Protein,UA: NEGATIVE
RBC, UA: NEGATIVE
Specific Gravity, UA: 1.02 (ref 1.005–1.030)
Urobilinogen, Ur: 0.2 mg/dL (ref 0.2–1.0)
pH, UA: 7 (ref 5.0–7.5)

## 2020-08-26 LAB — MICROSCOPIC EXAMINATION: Bacteria, UA: NONE SEEN

## 2020-08-26 NOTE — Progress Notes (Signed)
08/26/20  Chief Complaint  Patient presents with   Cysto     HPI: 70 year old male with refractory urinary symptoms related to BPH who presents today to the office for cystoscopy and prostate sizing.   Please see previous notes for details.     Vitals:   08/26/20 1042  BP: 126/70  Pulse: 60    NED. A&Ox3.   No respiratory distress   Abd soft, NT, ND Normal phallus with bilateral descended testicles    Cystoscopy Procedure Note  Patient identification was confirmed, informed consent was obtained, and patient was prepped using Betadine solution.  Lidocaine jelly was administered per urethral meatus.    Preoperative abx where received prior to procedure.     Pre-Procedure: - Inspection reveals a normal caliber ureteral meatus.  Procedure: The flexible cystoscope was introduced without difficulty - No urethral strictures/lesions are present. - Enlarged prostate trilobar coaptation - Elevated bladder neck - Bilateral ureteral orifices identified - Bladder mucosa  reveals no ulcers, tumors, or lesions - No bladder stones - Minimal trabeculation  Retroflexion shows well-circumscribed median lobe component with ball-valve effect into the bladder neck   Post-Procedure: - Patient tolerated the procedure well   Prostate transrectal ultrasound sizing   Informed consent was obtained after discussing risks/benefits of the procedure.  A time out was performed to ensure correct patient identity.   Pre-Procedure: -Transrectal probe was placed without difficulty -Transrectal Ultrasound performed revealing a 49.98 gm prostate measuring 3.57 x 4.67 x 5.73 cm (length) -Discrete well-circumscribed intravesical median lobe seen on TRUS   Assessment/ Plan:  1. Benign prostatic hyperplasia with weak urinary stream Severe obstructive urinary symptoms found to have an obstructive ball-valve like appearing median lobe on cystoscopy and TRUS which is likely the underlying etiology    We had a lengthy discussion today about his goals of care, initially interested in UroLift but with the presence of an enlarged well-circumscribed median lobe, he may not be the best candidate for this.  I explained my own limited experience with median lobe manipulation as well as offered to refer him to other urologist in the area who have greater expertise with advanced techniques such as Dr. Karrie Doffing in [place].  He is less interested in this.  Alternatively, he be an excellent candidate for laser nucleation primarily of the median lobe.  This being said, he would in fact be at risk for retrograde ejaculation and this may impact his ability to achieve a pregnancy secondary to retrograde ejaculation.  Please see previous note for details.  We reviewed the surgery in detail today including the preoperative, intraoperative, and postoperative course.  This will most likely be an outpatient procedure pending the degree of post op hematuria.  He will go home with catheter for a few days post op and will either be taught how to remove his own catheter or return to the office for catheter removal.  Risk of bleeding, infection, damage surrounding structures, injury to the bladder/ urethral, bladder neck contracture, ureteral stricture, retrograde ejaculation, stress/ urge incontinence, exacerbation of irritative voiding symptoms were all discussed in detail.    He would like to discuss this with his wife especially related to fertility issues.  Let us know if he like to proceed.  - Urinalysis, Complete    Hollice Espy, MD

## 2020-08-26 NOTE — Patient Instructions (Signed)

## 2020-09-09 DIAGNOSIS — L309 Dermatitis, unspecified: Secondary | ICD-10-CM | POA: Diagnosis not present

## 2020-09-09 DIAGNOSIS — R634 Abnormal weight loss: Secondary | ICD-10-CM | POA: Diagnosis not present

## 2020-09-09 DIAGNOSIS — I2510 Atherosclerotic heart disease of native coronary artery without angina pectoris: Secondary | ICD-10-CM | POA: Diagnosis not present

## 2020-09-09 DIAGNOSIS — R010 Benign and innocent cardiac murmurs: Secondary | ICD-10-CM | POA: Diagnosis not present

## 2020-09-09 DIAGNOSIS — I10 Essential (primary) hypertension: Secondary | ICD-10-CM | POA: Diagnosis not present

## 2020-09-09 DIAGNOSIS — E119 Type 2 diabetes mellitus without complications: Secondary | ICD-10-CM | POA: Diagnosis not present

## 2020-09-09 DIAGNOSIS — M109 Gout, unspecified: Secondary | ICD-10-CM | POA: Diagnosis not present

## 2020-09-15 DIAGNOSIS — H903 Sensorineural hearing loss, bilateral: Secondary | ICD-10-CM | POA: Diagnosis not present

## 2020-09-16 DIAGNOSIS — M109 Gout, unspecified: Secondary | ICD-10-CM | POA: Diagnosis not present

## 2020-09-16 DIAGNOSIS — E119 Type 2 diabetes mellitus without complications: Secondary | ICD-10-CM | POA: Diagnosis not present

## 2020-09-21 DIAGNOSIS — I10 Essential (primary) hypertension: Secondary | ICD-10-CM | POA: Diagnosis not present

## 2020-09-21 DIAGNOSIS — I2510 Atherosclerotic heart disease of native coronary artery without angina pectoris: Secondary | ICD-10-CM | POA: Diagnosis not present

## 2020-09-21 DIAGNOSIS — E119 Type 2 diabetes mellitus without complications: Secondary | ICD-10-CM | POA: Diagnosis not present

## 2020-09-21 DIAGNOSIS — L309 Dermatitis, unspecified: Secondary | ICD-10-CM | POA: Diagnosis not present

## 2020-09-21 DIAGNOSIS — R634 Abnormal weight loss: Secondary | ICD-10-CM | POA: Diagnosis not present

## 2020-09-21 DIAGNOSIS — R010 Benign and innocent cardiac murmurs: Secondary | ICD-10-CM | POA: Diagnosis not present

## 2020-09-21 DIAGNOSIS — M109 Gout, unspecified: Secondary | ICD-10-CM | POA: Diagnosis not present

## 2020-12-21 DIAGNOSIS — I10 Essential (primary) hypertension: Secondary | ICD-10-CM | POA: Diagnosis not present

## 2020-12-21 DIAGNOSIS — E119 Type 2 diabetes mellitus without complications: Secondary | ICD-10-CM | POA: Diagnosis not present

## 2020-12-22 DIAGNOSIS — I2510 Atherosclerotic heart disease of native coronary artery without angina pectoris: Secondary | ICD-10-CM | POA: Diagnosis not present

## 2020-12-22 DIAGNOSIS — Z0001 Encounter for general adult medical examination with abnormal findings: Secondary | ICD-10-CM | POA: Diagnosis not present

## 2020-12-22 DIAGNOSIS — E119 Type 2 diabetes mellitus without complications: Secondary | ICD-10-CM | POA: Diagnosis not present

## 2020-12-22 DIAGNOSIS — I10 Essential (primary) hypertension: Secondary | ICD-10-CM | POA: Diagnosis not present

## 2020-12-22 DIAGNOSIS — R010 Benign and innocent cardiac murmurs: Secondary | ICD-10-CM | POA: Diagnosis not present

## 2020-12-22 DIAGNOSIS — M109 Gout, unspecified: Secondary | ICD-10-CM | POA: Diagnosis not present

## 2020-12-22 DIAGNOSIS — Z1331 Encounter for screening for depression: Secondary | ICD-10-CM | POA: Diagnosis not present

## 2020-12-22 DIAGNOSIS — L309 Dermatitis, unspecified: Secondary | ICD-10-CM | POA: Diagnosis not present

## 2020-12-22 DIAGNOSIS — R634 Abnormal weight loss: Secondary | ICD-10-CM | POA: Diagnosis not present

## 2021-01-22 DIAGNOSIS — Z961 Presence of intraocular lens: Secondary | ICD-10-CM | POA: Diagnosis not present

## 2021-01-22 DIAGNOSIS — Z0100 Encounter for examination of eyes and vision without abnormal findings: Secondary | ICD-10-CM | POA: Diagnosis not present

## 2021-01-25 DIAGNOSIS — Z0100 Encounter for examination of eyes and vision without abnormal findings: Secondary | ICD-10-CM | POA: Diagnosis not present

## 2021-04-22 DIAGNOSIS — M109 Gout, unspecified: Secondary | ICD-10-CM | POA: Diagnosis not present

## 2021-04-22 DIAGNOSIS — E119 Type 2 diabetes mellitus without complications: Secondary | ICD-10-CM | POA: Diagnosis not present

## 2021-04-22 DIAGNOSIS — I10 Essential (primary) hypertension: Secondary | ICD-10-CM | POA: Diagnosis not present

## 2021-04-22 DIAGNOSIS — Z0001 Encounter for general adult medical examination with abnormal findings: Secondary | ICD-10-CM | POA: Diagnosis not present

## 2021-04-26 DIAGNOSIS — R634 Abnormal weight loss: Secondary | ICD-10-CM | POA: Diagnosis not present

## 2021-04-26 DIAGNOSIS — E119 Type 2 diabetes mellitus without complications: Secondary | ICD-10-CM | POA: Diagnosis not present

## 2021-04-26 DIAGNOSIS — M109 Gout, unspecified: Secondary | ICD-10-CM | POA: Diagnosis not present

## 2021-04-26 DIAGNOSIS — I2510 Atherosclerotic heart disease of native coronary artery without angina pectoris: Secondary | ICD-10-CM | POA: Diagnosis not present

## 2021-04-26 DIAGNOSIS — R010 Benign and innocent cardiac murmurs: Secondary | ICD-10-CM | POA: Diagnosis not present

## 2021-04-26 DIAGNOSIS — Z0001 Encounter for general adult medical examination with abnormal findings: Secondary | ICD-10-CM | POA: Diagnosis not present

## 2021-04-26 DIAGNOSIS — Z1331 Encounter for screening for depression: Secondary | ICD-10-CM | POA: Diagnosis not present

## 2021-04-26 DIAGNOSIS — I10 Essential (primary) hypertension: Secondary | ICD-10-CM | POA: Diagnosis not present

## 2021-04-26 DIAGNOSIS — L309 Dermatitis, unspecified: Secondary | ICD-10-CM | POA: Diagnosis not present

## 2021-05-24 DIAGNOSIS — Z9861 Coronary angioplasty status: Secondary | ICD-10-CM | POA: Diagnosis not present

## 2021-05-24 DIAGNOSIS — I340 Nonrheumatic mitral (valve) insufficiency: Secondary | ICD-10-CM | POA: Diagnosis not present

## 2021-05-24 DIAGNOSIS — I2510 Atherosclerotic heart disease of native coronary artery without angina pectoris: Secondary | ICD-10-CM | POA: Diagnosis not present

## 2021-05-24 DIAGNOSIS — I10 Essential (primary) hypertension: Secondary | ICD-10-CM | POA: Diagnosis not present

## 2021-05-24 DIAGNOSIS — I351 Nonrheumatic aortic (valve) insufficiency: Secondary | ICD-10-CM | POA: Diagnosis not present

## 2021-05-24 DIAGNOSIS — E782 Mixed hyperlipidemia: Secondary | ICD-10-CM | POA: Diagnosis not present

## 2021-08-26 DIAGNOSIS — I2510 Atherosclerotic heart disease of native coronary artery without angina pectoris: Secondary | ICD-10-CM | POA: Diagnosis not present

## 2021-08-26 DIAGNOSIS — E119 Type 2 diabetes mellitus without complications: Secondary | ICD-10-CM | POA: Diagnosis not present

## 2021-08-30 DIAGNOSIS — L309 Dermatitis, unspecified: Secondary | ICD-10-CM | POA: Diagnosis not present

## 2021-08-30 DIAGNOSIS — I10 Essential (primary) hypertension: Secondary | ICD-10-CM | POA: Diagnosis not present

## 2021-08-30 DIAGNOSIS — R634 Abnormal weight loss: Secondary | ICD-10-CM | POA: Diagnosis not present

## 2021-08-30 DIAGNOSIS — I2510 Atherosclerotic heart disease of native coronary artery without angina pectoris: Secondary | ICD-10-CM | POA: Diagnosis not present

## 2021-08-30 DIAGNOSIS — M109 Gout, unspecified: Secondary | ICD-10-CM | POA: Diagnosis not present

## 2021-08-30 DIAGNOSIS — R010 Benign and innocent cardiac murmurs: Secondary | ICD-10-CM | POA: Diagnosis not present

## 2021-08-30 DIAGNOSIS — E119 Type 2 diabetes mellitus without complications: Secondary | ICD-10-CM | POA: Diagnosis not present

## 2021-09-16 DIAGNOSIS — I340 Nonrheumatic mitral (valve) insufficiency: Secondary | ICD-10-CM | POA: Diagnosis not present

## 2021-11-02 DIAGNOSIS — I340 Nonrheumatic mitral (valve) insufficiency: Secondary | ICD-10-CM | POA: Diagnosis not present

## 2021-11-02 DIAGNOSIS — I350 Nonrheumatic aortic (valve) stenosis: Secondary | ICD-10-CM | POA: Diagnosis not present

## 2021-11-02 DIAGNOSIS — I351 Nonrheumatic aortic (valve) insufficiency: Secondary | ICD-10-CM | POA: Diagnosis not present

## 2021-11-02 DIAGNOSIS — I2510 Atherosclerotic heart disease of native coronary artery without angina pectoris: Secondary | ICD-10-CM | POA: Diagnosis not present

## 2021-11-02 DIAGNOSIS — E782 Mixed hyperlipidemia: Secondary | ICD-10-CM | POA: Diagnosis not present

## 2021-11-02 DIAGNOSIS — I10 Essential (primary) hypertension: Secondary | ICD-10-CM | POA: Diagnosis not present

## 2021-12-29 DIAGNOSIS — M109 Gout, unspecified: Secondary | ICD-10-CM | POA: Diagnosis not present

## 2021-12-29 DIAGNOSIS — I10 Essential (primary) hypertension: Secondary | ICD-10-CM | POA: Diagnosis not present

## 2021-12-29 DIAGNOSIS — E119 Type 2 diabetes mellitus without complications: Secondary | ICD-10-CM | POA: Diagnosis not present

## 2022-01-04 DIAGNOSIS — L309 Dermatitis, unspecified: Secondary | ICD-10-CM | POA: Diagnosis not present

## 2022-01-04 DIAGNOSIS — E119 Type 2 diabetes mellitus without complications: Secondary | ICD-10-CM | POA: Diagnosis not present

## 2022-01-04 DIAGNOSIS — R010 Benign and innocent cardiac murmurs: Secondary | ICD-10-CM | POA: Diagnosis not present

## 2022-01-04 DIAGNOSIS — I2510 Atherosclerotic heart disease of native coronary artery without angina pectoris: Secondary | ICD-10-CM | POA: Diagnosis not present

## 2022-01-04 DIAGNOSIS — L2082 Flexural eczema: Secondary | ICD-10-CM | POA: Diagnosis not present

## 2022-01-04 DIAGNOSIS — I10 Essential (primary) hypertension: Secondary | ICD-10-CM | POA: Diagnosis not present

## 2022-01-04 DIAGNOSIS — M109 Gout, unspecified: Secondary | ICD-10-CM | POA: Diagnosis not present

## 2022-01-04 DIAGNOSIS — R634 Abnormal weight loss: Secondary | ICD-10-CM | POA: Diagnosis not present

## 2022-02-08 DIAGNOSIS — H524 Presbyopia: Secondary | ICD-10-CM | POA: Diagnosis not present

## 2022-02-08 DIAGNOSIS — Z0100 Encounter for examination of eyes and vision without abnormal findings: Secondary | ICD-10-CM | POA: Diagnosis not present

## 2022-04-02 ENCOUNTER — Other Ambulatory Visit: Payer: Self-pay | Admitting: Cardiovascular Disease

## 2022-04-02 DIAGNOSIS — I2510 Atherosclerotic heart disease of native coronary artery without angina pectoris: Secondary | ICD-10-CM

## 2022-04-02 DIAGNOSIS — I10 Essential (primary) hypertension: Secondary | ICD-10-CM

## 2022-04-02 DIAGNOSIS — E782 Mixed hyperlipidemia: Secondary | ICD-10-CM

## 2022-05-09 ENCOUNTER — Ambulatory Visit: Payer: Self-pay | Admitting: Internal Medicine

## 2022-06-24 ENCOUNTER — Other Ambulatory Visit: Payer: Self-pay | Admitting: Cardiovascular Disease

## 2022-06-24 ENCOUNTER — Other Ambulatory Visit: Payer: Self-pay | Admitting: Internal Medicine

## 2022-06-24 ENCOUNTER — Other Ambulatory Visit: Payer: Self-pay

## 2022-06-24 DIAGNOSIS — I2510 Atherosclerotic heart disease of native coronary artery without angina pectoris: Secondary | ICD-10-CM

## 2022-06-24 DIAGNOSIS — I10 Essential (primary) hypertension: Secondary | ICD-10-CM

## 2022-06-24 DIAGNOSIS — E119 Type 2 diabetes mellitus without complications: Secondary | ICD-10-CM | POA: Diagnosis not present

## 2022-06-24 DIAGNOSIS — M109 Gout, unspecified: Secondary | ICD-10-CM | POA: Diagnosis not present

## 2022-06-25 LAB — LIPID PANEL W/O CHOL/HDL RATIO
Cholesterol, Total: 186 mg/dL (ref 100–199)
HDL: 63 mg/dL (ref >39)
LDL Chol Calc (NIH): 105 mg/dL — ABNORMAL HIGH (ref 0–99)
Triglycerides: 103 mg/dL (ref 0–149)
VLDL Cholesterol Cal: 18 mg/dL (ref 5–40)

## 2022-06-25 LAB — COMPREHENSIVE METABOLIC PANEL
ALT: 13 IU/L (ref 0–44)
AST: 16 IU/L (ref 0–40)
Albumin/Globulin Ratio: 2.4 — ABNORMAL HIGH (ref 1.2–2.2)
Albumin: 4.3 g/dL (ref 3.8–4.8)
Alkaline Phosphatase: 71 IU/L (ref 44–121)
BUN/Creatinine Ratio: 23 (ref 10–24)
BUN: 22 mg/dL (ref 8–27)
Bilirubin Total: 1.5 mg/dL — ABNORMAL HIGH (ref 0.0–1.2)
CO2: 21 mmol/L (ref 20–29)
Calcium: 9 mg/dL (ref 8.6–10.2)
Chloride: 104 mmol/L (ref 96–106)
Creatinine, Ser: 0.97 mg/dL (ref 0.76–1.27)
Globulin, Total: 1.8 g/dL (ref 1.5–4.5)
Glucose: 123 mg/dL — ABNORMAL HIGH (ref 70–99)
Potassium: 4.4 mmol/L (ref 3.5–5.2)
Sodium: 144 mmol/L (ref 134–144)
Total Protein: 6.1 g/dL (ref 6.0–8.5)
eGFR: 83 mL/min/{1.73_m2} (ref >59)

## 2022-06-25 LAB — URIC ACID: Uric Acid: 5.7 mg/dL (ref 3.8–8.4)

## 2022-06-25 LAB — CBC WITH DIFFERENTIAL/PLATELET
Basophils Absolute: 0.1 10*3/uL (ref 0.0–0.2)
Basos: 2 %
EOS (ABSOLUTE): 0.2 10*3/uL (ref 0.0–0.4)
Eos: 5 %
Hematocrit: 45.2 % (ref 37.5–51.0)
Hemoglobin: 14.7 g/dL (ref 13.0–17.7)
Immature Grans (Abs): 0 10*3/uL (ref 0.0–0.1)
Immature Granulocytes: 0 %
Lymphocytes Absolute: 1.1 10*3/uL (ref 0.7–3.1)
Lymphs: 21 %
MCH: 26 pg — ABNORMAL LOW (ref 26.6–33.0)
MCHC: 32.5 g/dL (ref 31.5–35.7)
MCV: 80 fL (ref 79–97)
Monocytes Absolute: 0.4 10*3/uL (ref 0.1–0.9)
Monocytes: 8 %
Neutrophils Absolute: 3.1 10*3/uL (ref 1.4–7.0)
Neutrophils: 64 %
Platelets: 163 10*3/uL (ref 150–450)
RBC: 5.66 x10E6/uL (ref 4.14–5.80)
RDW: 13.4 % (ref 11.6–15.4)
WBC: 5 10*3/uL (ref 3.4–10.8)

## 2022-06-25 LAB — HGB A1C W/O EAG: Hgb A1c MFr Bld: 6.7 % — ABNORMAL HIGH (ref 4.8–5.6)

## 2022-06-27 ENCOUNTER — Ambulatory Visit (INDEPENDENT_AMBULATORY_CARE_PROVIDER_SITE_OTHER): Payer: Self-pay | Admitting: Internal Medicine

## 2022-06-27 ENCOUNTER — Encounter: Payer: Self-pay | Admitting: Internal Medicine

## 2022-06-27 VITALS — BP 130/80 | HR 78 | Ht 70.0 in | Wt 165.4 lb

## 2022-06-27 DIAGNOSIS — Z1331 Encounter for screening for depression: Secondary | ICD-10-CM | POA: Diagnosis not present

## 2022-06-27 DIAGNOSIS — E119 Type 2 diabetes mellitus without complications: Secondary | ICD-10-CM | POA: Diagnosis not present

## 2022-06-27 DIAGNOSIS — Z0001 Encounter for general adult medical examination with abnormal findings: Secondary | ICD-10-CM

## 2022-06-27 DIAGNOSIS — I2510 Atherosclerotic heart disease of native coronary artery without angina pectoris: Secondary | ICD-10-CM

## 2022-06-27 DIAGNOSIS — I10 Essential (primary) hypertension: Secondary | ICD-10-CM

## 2022-06-27 DIAGNOSIS — E782 Mixed hyperlipidemia: Secondary | ICD-10-CM | POA: Insufficient documentation

## 2022-06-27 LAB — GLUCOSE, POCT (MANUAL RESULT ENTRY): POC Glucose: 138 mg/dl — AB (ref 70–99)

## 2022-06-27 NOTE — Progress Notes (Signed)
Established Patient Office Visit  Subjective:  Patient ID: Zachary Gordon, male    DOB: 07/12/1950  Age: 72 y.o. MRN: [id number]  Chief Complaint  Patient presents with   Annual Exam    AWV    No new complaints, here AWV and refer to scanned documents.  No new complaints, here for lab review and medication refills. Labs reviewed and notable for well controlled diabetes, A1c at target but deteriorated to which he admits dietary indiscretion. LDL not at target with unremarkable cmp. Denies any hypoglycemic episodes and home bg readings have been at target.     No other concerns at this time.   Past Medical History:  Diagnosis Date   Chronic kidney disease    stones   Coronary artery disease    heart stent   Diabetes mellitus without complication (HCC)    Gout    Heart disease    Heart murmur    History of kidney stones    History of nephrectomy    Hyperlipidemia    Hypertension    Myocardial infarction [facility])     Past Surgical History:  Procedure Laterality Date   COLONOSCOPY WITH PROPOFOL N/A 01/10/2017   Procedure: COLONOSCOPY WITH PROPOFOL;  Surgeon: Toledo, Boykin Nearing, MD;  Location: [facility] ENDOSCOPY;  Service: Gastroenterology;  Laterality: N/A;   COLONOSCOPY WITH PROPOFOL N/A 02/20/2018   Procedure: COLONOSCOPY WITH PROPOFOL;  Surgeon: Toledo, Boykin Nearing, MD;  Location: [facility] ENDOSCOPY;  Service: Gastroenterology;  Laterality: N/A;   CORONARY ANGIOPLASTY WITH STENT PLACEMENT     EYE SURGERY     LEFT HEART CATH AND CORONARY ANGIOGRAPHY Right 01/24/2017   Procedure: LEFT HEART CATH AND CORONARY ANGIOGRAPHY;  Surgeon: Laurier Nancy, MD;  Location: [facility] INVASIVE CV LAB;  Service: Cardiovascular;  Laterality: Right;   NEPHRECTOMY Left     Social History   Socioeconomic History   Marital status: Married    Spouse name: Not on file   Number of children: Not on file   Years of education: Not on file   Highest education level: Not on file  Occupational History   Not  on file  Tobacco Use   Smoking status: Never   Smokeless tobacco: Never  Vaping Use   Vaping Use: Former  Substance and Sexual Activity   Alcohol use: Not Currently   Drug use: Not Currently   Sexual activity: Not on file  Other Topics Concern   Not on file  Social History Narrative   Not on file   Social Determinants of Health   Financial Resource Strain: Not on file  Food Insecurity: Not on file  Transportation Needs: Not on file  Physical Activity: Not on file  Stress: Not on file  Social Connections: Not on file  Intimate Partner Violence: Not on file    Family History  Problem Relation Age of Onset   Prostate cancer Neg Hx    Bladder Cancer Neg Hx    Kidney cancer Neg Hx     Allergies  Allergen Reactions   Penicillins Rash    Has patient had a PCN reaction causing immediate rash, facial/tongue/throat swelling, SOB or lightheadedness with hypotension: Yes Has patient had a PCN reaction causing severe rash involving mucus membranes or skin necrosis: No Has patient had a PCN reaction that required hospitalization: No Has patient had a PCN reaction occurring within the last 10 years: No If all of the above answers are "NO", then may proceed with Cephalosporin use.  Review of Systems  Constitutional: Negative.   HENT: Negative.    Eyes: Negative.   Respiratory: Negative.    Cardiovascular: Negative.   Gastrointestinal: Negative.   Genitourinary: Negative.   Skin: Negative.   Neurological: Negative.   Endo/Heme/Allergies: Negative.        Objective:   BP 130/80   Pulse 78   Ht 5\' 10"  (1.778 m)   Wt 165 lb 6.4 oz (75 kg)   SpO2 97%   BMI 23.73 kg/m   Vitals:   06/27/22 1334  BP: 130/80  Pulse: 78  Height: 5\' 10"  (1.778 m)  Weight: 165 lb 6.4 oz (75 kg)  SpO2: 97%  BMI (Calculated): 23.73    Physical Exam Vitals reviewed.  Constitutional:      Appearance: Normal appearance.  HENT:     Head: Normocephalic.     Left Ear: There is no  impacted cerumen.     Nose: Nose normal.     Mouth/Throat:     Mouth: Mucous membranes are moist.     Pharynx: No posterior oropharyngeal erythema.  Eyes:     Extraocular Movements: Extraocular movements intact.     Pupils: Pupils are equal, round, and reactive to light.  Cardiovascular:     Rate and Rhythm: Regular rhythm.     Chest Wall: PMI is not displaced.     Pulses: Normal pulses.     Heart sounds: Normal heart sounds. No murmur heard. Pulmonary:     Effort: Pulmonary effort is normal.     Breath sounds: Normal air entry. No rhonchi or rales.  Abdominal:     General: Abdomen is flat. Bowel sounds are normal. There is no distension.     Palpations: Abdomen is soft. There is no hepatomegaly, splenomegaly or mass.     Tenderness: There is no abdominal tenderness.  Musculoskeletal:        General: Normal range of motion.     Cervical back: Normal range of motion and neck supple.     Right lower leg: No edema.     Left lower leg: No edema.  Skin:    General: Skin is warm and dry.  Neurological:     General: No focal deficit present.     Mental Status: He is alert and oriented to person, place, and time.     Cranial Nerves: No cranial nerve deficit.     Motor: No weakness.  Psychiatric:        Mood and Affect: Mood normal.        Behavior: Behavior normal.      Results for orders placed or performed in visit on 06/27/22  POCT Glucose (CBG)  Result Value Ref Range   POC Glucose 138 (A) 70 - 99 mg/dl    Recent Results (from the past 2160 hour(Madeleine Fenn))  CBC with Differential/Platelet     Status: Abnormal   Collection Time: 06/24/22  8:41 AM  Result Value Ref Range   WBC 5.0 3.4 - 10.8 x10E3/uL   RBC 5.66 4.14 - 5.80 x10E6/uL   Hemoglobin 14.7 13.0 - 17.7 g/dL   Hematocrit 40.9 81.1 - 51.0 %   MCV 80 79 - 97 fL   MCH 26.0 (L) 26.6 - 33.0 pg   MCHC 32.5 31.5 - 35.7 g/dL   RDW 91.4 78.2 - 95.6 %   Platelets 163 150 - 450 x10E3/uL   Neutrophils 64 Not Estab. %   Lymphs  21 Not Estab. %   Monocytes 8 Not  Estab. %   Eos 5 Not Estab. %   Basos 2 Not Estab. %   Neutrophils Absolute 3.1 1.4 - 7.0 x10E3/uL   Lymphocytes Absolute 1.1 0.7 - 3.1 x10E3/uL   Monocytes Absolute 0.4 0.1 - 0.9 x10E3/uL   EOS (ABSOLUTE) 0.2 0.0 - 0.4 x10E3/uL   Basophils Absolute 0.1 0.0 - 0.2 x10E3/uL   Immature Granulocytes 0 Not Estab. %   Immature Grans (Abs) 0.0 0.0 - 0.1 x10E3/uL  Comprehensive metabolic panel     Status: Abnormal   Collection Time: 06/24/22  8:41 AM  Result Value Ref Range   Glucose 123 (H) 70 - 99 mg/dL   BUN 22 8 - 27 mg/dL   Creatinine, Ser 3.32 0.76 - 1.27 mg/dL   eGFR 83 >95 JO/ACZ/6.60   BUN/Creatinine Ratio 23 10 - 24   Sodium 144 134 - 144 mmol/L   Potassium 4.4 3.5 - 5.2 mmol/L   Chloride 104 96 - 106 mmol/L   CO2 21 20 - 29 mmol/L   Calcium 9.0 8.6 - 10.2 mg/dL   Total Protein 6.1 6.0 - 8.5 g/dL   Albumin 4.3 3.8 - 4.8 g/dL   Globulin, Total 1.8 1.5 - 4.5 g/dL   Albumin/Globulin Ratio 2.4 (H) 1.2 - 2.2   Bilirubin Total 1.5 (H) 0.0 - 1.2 mg/dL   Alkaline Phosphatase 71 44 - 121 IU/L   AST 16 0 - 40 IU/L   ALT 13 0 - 44 IU/L  Lipid Panel w/o Chol/HDL Ratio     Status: Abnormal   Collection Time: 06/24/22  8:41 AM  Result Value Ref Range   Cholesterol, Total 186 100 - 199 mg/dL   Triglycerides 630 0 - 149 mg/dL   HDL 63 >16 mg/dL   VLDL Cholesterol Cal 18 5 - 40 mg/dL   LDL Chol Calc (NIH) 010 (H) 0 - 99 mg/dL  Hgb X3A w/o eAG     Status: Abnormal   Collection Time: 06/24/22  8:41 AM  Result Value Ref Range   Hgb A1c MFr Bld 6.7 (H) 4.8 - 5.6 %    Comment:          Prediabetes: 5.7 - 6.4          Diabetes: >6.4          Glycemic control for adults with diabetes: <7.0   Uric acid     Status: None   Collection Time: 06/24/22  8:41 AM  Result Value Ref Range   Uric Acid 5.7 3.8 - 8.4 mg/dL    Comment:            Therapeutic target for gout patients: <6.0  POCT Glucose (CBG)     Status: Abnormal   Collection Time: 06/27/22  1:40 PM   Result Value Ref Range   POC Glucose 138 (A) 70 - 99 mg/dl      Assessment & Plan:   Problem List Items Addressed This Visit   None Visit Diagnoses     Type 2 diabetes mellitus without complication, without long-term current use of insulin (HCC)    -  Primary   Relevant Orders   POCT Glucose (CBG) (Completed)       No follow-ups on file.   Total time spent: 35 minutes  Luna Fuse, MD  06/27/2022

## 2022-07-13 ENCOUNTER — Ambulatory Visit: Payer: Self-pay | Admitting: Internal Medicine

## 2022-07-23 ENCOUNTER — Other Ambulatory Visit: Payer: Self-pay | Admitting: Internal Medicine

## 2022-09-19 ENCOUNTER — Encounter: Payer: Self-pay | Admitting: Cardiology

## 2022-09-19 ENCOUNTER — Ambulatory Visit (INDEPENDENT_AMBULATORY_CARE_PROVIDER_SITE_OTHER): Payer: Self-pay | Admitting: Cardiology

## 2022-09-19 VITALS — BP 135/90 | HR 72 | Ht 70.0 in | Wt 173.8 lb

## 2022-09-19 DIAGNOSIS — E782 Mixed hyperlipidemia: Secondary | ICD-10-CM | POA: Diagnosis not present

## 2022-09-19 DIAGNOSIS — I2510 Atherosclerotic heart disease of native coronary artery without angina pectoris: Secondary | ICD-10-CM

## 2022-09-19 DIAGNOSIS — I10 Essential (primary) hypertension: Secondary | ICD-10-CM | POA: Diagnosis not present

## 2022-09-19 DIAGNOSIS — Z9861 Coronary angioplasty status: Secondary | ICD-10-CM | POA: Diagnosis not present

## 2022-09-19 DIAGNOSIS — I350 Nonrheumatic aortic (valve) stenosis: Secondary | ICD-10-CM | POA: Diagnosis not present

## 2022-09-19 NOTE — Assessment & Plan Note (Signed)
Continue rosuvastatin and Repatha.

## 2022-09-19 NOTE — Progress Notes (Signed)
Cardiology Office Note   Date:  09/19/2022   ID:  Zachary Gordon, DOB 1950/09/08, MRN [id number]  PCP:  Sherron Monday, MD  Cardiologist:  Marisue Ivan, NP      History of Present Illness: Zachary Gordon is a 72 y.o. male who presents for  Chief Complaint  Patient presents with   Follow-up    1 yr F/U    Patient in office for routine cardiac exam. Denies chest pain, shortness of breath, dizziness, lower extremity edema.       Past Medical History:  Diagnosis Date   Chronic kidney disease    stones   Coronary artery disease    heart stent   Diabetes mellitus without complication (HCC)    Gout    Heart disease    Heart murmur    History of kidney stones    History of nephrectomy    Hyperlipidemia    Hypertension    Myocardial infarction [facility])      Past Surgical History:  Procedure Laterality Date   COLONOSCOPY WITH PROPOFOL N/A 01/10/2017   Procedure: COLONOSCOPY WITH PROPOFOL;  Surgeon: Toledo, Boykin Nearing, MD;  Location: [facility] ENDOSCOPY;  Service: Gastroenterology;  Laterality: N/A;   COLONOSCOPY WITH PROPOFOL N/A 02/20/2018   Procedure: COLONOSCOPY WITH PROPOFOL;  Surgeon: Toledo, Boykin Nearing, MD;  Location: [facility] ENDOSCOPY;  Service: Gastroenterology;  Laterality: N/A;   CORONARY ANGIOPLASTY WITH STENT PLACEMENT     EYE SURGERY     LEFT HEART CATH AND CORONARY ANGIOGRAPHY Right 01/24/2017   Procedure: LEFT HEART CATH AND CORONARY ANGIOGRAPHY;  Surgeon: Laurier Nancy, MD;  Location: [facility] INVASIVE CV LAB;  Service: Cardiovascular;  Laterality: Right;   NEPHRECTOMY Left      Current Outpatient Medications  Medication Sig Dispense Refill   amLODipine (NORVASC) 5 MG tablet Take 5 mg by mouth daily.      aspirin EC 81 MG tablet Take 1 tablet (81 mg total) by mouth daily. 30 tablet 1   carvedilol (COREG) 6.25 MG tablet TAKE 1 TABLET TWICE DAILY 180 tablet 1   celecoxib (CELEBREX) 200 MG capsule TAKE 1 TO 2 CAPSULES EVERY DAY AS DIRECTED 180 capsule 1    clopidogrel (PLAVIX) 75 MG tablet TAKE 1 TABLET EVERY DAY 90 tablet 1   isosorbide mononitrate (IMDUR) 30 MG 24 hr tablet TAKE 1 TABLET EVERY DAY 90 tablet 1   lisinopril (ZESTRIL) 20 MG tablet TAKE 1 TABLET EVERY MORNING 90 tablet 1   REPATHA SURECLICK 140 MG/ML SOAJ Inject into the skin.     rosuvastatin (CRESTOR) 40 MG tablet TAKE 1 TABLET EVERY DAY 90 tablet 1   triamcinolone cream (KENALOG) 0.1 % Apply 1 Application topically as needed.     No current facility-administered medications for this visit.    Allergies:   Penicillins    Social History:   reports that he has never smoked. He has never used smokeless tobacco. He reports that he does not currently use alcohol. He reports that he does not currently use drugs.   Family History:  family history is not on file.    ROS:     Review of Systems  Constitutional: Negative.   HENT: Negative.    Eyes: Negative.   Respiratory: Negative.    Cardiovascular: Negative.   Gastrointestinal: Negative.   Genitourinary: Negative.   Musculoskeletal: Negative.   Skin: Negative.   Neurological: Negative.   Endo/Heme/Allergies: Negative.   Psychiatric/Behavioral: Negative.    All other systems reviewed  and are negative.     All other systems are reviewed and negative.    PHYSICAL EXAM: VS:  BP (!) 135/90   Pulse 72   Ht 5\' 10"  (1.778 m)   Wt 173 lb 12.8 oz (78.8 kg)   SpO2 99%   BMI 24.94 kg/m  , BMI Body mass index is 24.94 kg/m. Last weight:  Wt Readings from Last 3 Encounters:  09/19/22 173 lb 12.8 oz (78.8 kg)  06/27/22 165 lb 6.4 oz (75 kg)  08/26/20 176 lb (79.8 kg)     Physical Exam Vitals and nursing note reviewed.  Constitutional:      Appearance: Normal appearance. He is normal weight.  HENT:     Head: Normocephalic and atraumatic.     Nose: Nose normal.     Mouth/Throat:     Mouth: Mucous membranes are moist.     Pharynx: Oropharynx is clear.  Eyes:     Extraocular Movements: Extraocular movements  intact.     Conjunctiva/sclera: Conjunctivae normal.     Pupils: Pupils are equal, round, and reactive to light.  Cardiovascular:     Rate and Rhythm: Normal rate and regular rhythm.     Pulses: Normal pulses.     Heart sounds: Normal heart sounds.  Pulmonary:     Effort: Pulmonary effort is normal.     Breath sounds: Normal breath sounds.  Abdominal:     General: Abdomen is flat. Bowel sounds are normal.     Palpations: Abdomen is soft.  Musculoskeletal:        General: Normal range of motion.     Cervical back: Normal range of motion.  Skin:    General: Skin is warm and dry.  Neurological:     General: No focal deficit present.     Mental Status: He is alert and oriented to person, place, and time. Mental status is at baseline.  Psychiatric:        Mood and Affect: Mood normal.        Behavior: Behavior normal.        Thought Content: Thought content normal.        Judgment: Judgment normal.      EKG: none today  Recent Labs: 06/24/2022: ALT 13; BUN 22; Creatinine, Ser 0.97; Hemoglobin 14.7; Platelets 163; Potassium 4.4; Sodium 144    Lipid Panel    Component Value Date/Time   CHOL 186 06/24/2022 0841   TRIG 103 06/24/2022 0841   HDL 63 06/24/2022 0841   CHOLHDL 4.4 01/24/2017 0501   VLDL 32 01/24/2017 0501   LDLCALC 105 (H) 06/24/2022 0841    Other studies Reviewed:  Patient: [id number] - Zachary Gordon DOB:  January 26, 1951  Date:  09/16/2021 10:30 Provider: Adrian Blackwater MD Encounter: ECHO  TESTS  Imaging: Echocardiogram:  An echocardiogram in (2-d) mode was performed and in Doppler mode with color flow velocity mapping was performed. The aortic valve cusps are abnormal 1.6 cm, flow velocity 2.22   m/s, and systolic calculated mean flow gradient 11  mmHg. Mitral valve diastolic peak flow velocity E .568    m/s and E/A ratio 0.6. Aortic root diameter 2.6   cm. The LVOT internal diameter 2.9 cm and flow velocity was abnormal .978  m/s. LV systolic dimension 2.49     cm, diastolic 3.74  cm, posterior wall thickness 1.41   cm, fractional shortening 33.4 %, and EF 62.9 %. IVS thickness 1.17   cm. LA dimension 4.2 cm. Mitral Valve  has Mild Regurgitation. Pulmonic Valve has Trace Regurgitation. Tricuspid Valve has Trace Regurgitation.     ASSESSMENT  Technically adequate study.  Normal chamber sizes.  Normal left ventricular systolic function.  Mild left ventricular hypertrophy with GRADE 1 (relaxation abnormality) diastolic dysfunction.  Normal right ventricular systolic function.  Normal right ventricular diastolic function.  Normal left ventricular wall motion.  Normal right ventricular wall motion.  Trace pulmonary regurgitation.  Trace tricuspid regurgitation.  Normal pulmonary artery pressure.  Mild mitral regurgitation.  Fibrocalcified aortic valve with Mild  aortic stenosis.   No pericardial effusion.  Mild LVH.  THERAPY   Referring physician: Laurier Nancy  Sonographer: Adrian Blackwater.   Adrian Blackwater MD  Electronically signed by: Adrian Blackwater     Date: 09/17/2021 08:47  Patient: [id number] - Gibbs A. Clemon DOB:  1950/12/19  Date:  01/15/2020 07:45 Provider: Adrian Blackwater MD Encounter: [facility]                                                                                        [facility] [address] [place], [place] [address] [phone] STUDY:  Gated Stress / Rest Myocardial Perfusion Imaging Tomographic (SPECT) Including attenuation correction Wall Motion, Left Ventricular Ejection Fraction By Gated Technique.Treadmill Stress Test. SEX: Male       WEIGHT: 165 lbs   HEIGHT: 70 in                     ARMS UP: YES/NO                                                                        REFERRING PHYSICIAN: Dr.Shaukat Welton Flakes  INDICATION FOR STUDY: CAD                                                                                                                                                                                                                    TECHNIQUE:  Approximately 20 minutes following the intravenous administration of 10.2 mCi of Tc-36m Sestamibi after stress testing in a reclined supine position with arms above their head if able to do so, gated SPECT imaging of the heart was performed. After about a 2hr break, the patient was injected intravenously with 31.6 mCi of Tc-82m Sestamibi.  Approximately 45 minutes later in the same position as stress imaging SPECT rest imaging of the heart was performed.  STRESS BY:  Adrian Blackwater, MD PROTOCOL:   Smitty Cords                                                                                       MAX PRED HR: 152                     85%: 129               75%:  114                                                                                                                   RESTING BP: 130/70   RESTING HR: 77   PEAK BP: 160/82   PEAK HR: 136 (89%)  EXERCISE DURATION: 9:51                                             METS: 11.5     REASON FOR TEST TERMINATION: Target reached/1 min post injection                                                                                                                                  SYMPTOMS: None  [place]KE TREADMILL SCORE: 10                                       RISK: Low                                                                                                                                                                                                            EKG RESULTS:  NSR. 71/min. Previous anterolateral infarct. No significant ST changes at peak exercise.                                                              IMAGE QUALITY: Fair  PERFUSION/WALL MOTION FINDINGS:  EF = 56%. Moderate size and intensity fixed mid anterior, mid anteroseptal, mid inferoseptal, apical anterior, apical septal, apical inferior, apical lateral, and apex (17) wall defects, normal wall motion.                                                                        IMPRESSION: Infarction in the LAD territory with preserved LV function.                                                                                                                                                                                                                                                                                    Adrian Blackwater, MD Stress Interpreting Physician / Nuclear Interpreting Physician        Adrian Blackwater MD  Electronically signed by: Adrian Blackwater     Date: 01/24/2020 12:17   ASSESSMENT AND PLAN:    ICD-10-CM   1. Coronary artery disease involving native coronary artery of native heart without angina pectoris  I25.10     2. Primary hypertension  I10     3. Mixed hyperlipidemia  E78.2     4. Postsurgical percutaneous transluminal coronary angioplasty status  Z98.61     5. Nonrheumatic aortic valve stenosis  I35.0        Problem List Items Addressed This Visit       Cardiovascular and Mediastinum   Coronary artery disease involving native coronary artery of native heart without angina pectoris - Primary    Denies chest pain.  History of PCI done 04/12/09 Stent Promus 3.59mmx23 mm at [facility] med center      Primary hypertension   Nonrheumatic aortic valve stenosis    Echo 08/2021 Fibrocalcified  aortic valve with Mild  aortic stenosis.          Other   Mixed hyperlipidemia    Continue rosuvastatin and Repatha.  Postsurgical percutaneous transluminal coronary angioplasty status     Disposition:   Return in about 1 year (around 09/19/2023) for only wants to see Dr. Welton Flakes.    Total time spent: 30 minutes  Signed,  Google, NP  09/19/2022 10:36 AM    [facility]

## 2022-09-19 NOTE — Assessment & Plan Note (Addendum)
Denies chest pain.  History of PCI done 04/12/09 Stent Promus 3.43mmx23 mm at [facility] med center

## 2022-09-19 NOTE — Assessment & Plan Note (Signed)
Echo 08/2021 Fibrocalcified aortic valve with Mild  aortic stenosis.

## 2022-09-22 ENCOUNTER — Other Ambulatory Visit: Payer: Self-pay

## 2022-09-22 DIAGNOSIS — E119 Type 2 diabetes mellitus without complications: Secondary | ICD-10-CM

## 2022-09-22 DIAGNOSIS — E782 Mixed hyperlipidemia: Secondary | ICD-10-CM | POA: Diagnosis not present

## 2022-09-22 DIAGNOSIS — I10 Essential (primary) hypertension: Secondary | ICD-10-CM | POA: Diagnosis not present

## 2022-09-27 ENCOUNTER — Ambulatory Visit (INDEPENDENT_AMBULATORY_CARE_PROVIDER_SITE_OTHER): Payer: Self-pay | Admitting: Internal Medicine

## 2022-09-27 VITALS — BP 130/85 | HR 74 | Ht 70.0 in | Wt 174.8 lb

## 2022-09-27 DIAGNOSIS — E119 Type 2 diabetes mellitus without complications: Secondary | ICD-10-CM | POA: Diagnosis not present

## 2022-09-27 DIAGNOSIS — E782 Mixed hyperlipidemia: Secondary | ICD-10-CM | POA: Diagnosis not present

## 2022-09-27 LAB — POCT CBG (FASTING - GLUCOSE)-MANUAL ENTRY: Glucose Fasting, POC: 137 mg/dL — AB (ref 70–99)

## 2022-09-27 LAB — POC CREATINE & ALBUMIN,URINE
Albumin/Creatinine Ratio, Urine, POC: <30
Creatinine, POC: 100 mg/dL
Microalbumin Ur, POC: 10 mg/L

## 2022-09-27 NOTE — Progress Notes (Signed)
Established Patient Office Visit  Subjective:  Patient ID: Zachary Gordon, male    DOB: 12-06-1950  Age: 72 y.o. MRN: [id number]  Chief Complaint  Patient presents with  . Follow-up    3 mo f/u with labs    No new complaints, here for lab review and medication refills. Labs reviewed and notable for improved and well controlled diabetes, A1c at target, lipids also improved with unremarkable cmp. No recent chest pain or SOB. Exercises regularly 3x per week.   No other concerns at this time.   Past Medical History:  Diagnosis Date  . Chronic kidney disease    stones  . Coronary artery disease    heart stent  . Diabetes mellitus without complication (HCC)   . Gout   . Heart disease   . Heart murmur   . History of kidney stones   . History of nephrectomy   . Hyperlipidemia   . Hypertension   . Myocardial infarction [facility])     Past Surgical History:  Procedure Laterality Date  . COLONOSCOPY WITH PROPOFOL N/A 01/10/2017   Procedure: COLONOSCOPY WITH PROPOFOL;  Surgeon: Toledo, Boykin Nearing, MD;  Location: [facility] ENDOSCOPY;  Service: Gastroenterology;  Laterality: N/A;  . COLONOSCOPY WITH PROPOFOL N/A 02/20/2018   Procedure: COLONOSCOPY WITH PROPOFOL;  Surgeon: Toledo, Boykin Nearing, MD;  Location: [facility] ENDOSCOPY;  Service: Gastroenterology;  Laterality: N/A;  . CORONARY ANGIOPLASTY WITH STENT PLACEMENT    . EYE SURGERY    . LEFT HEART CATH AND CORONARY ANGIOGRAPHY Right 01/24/2017   Procedure: LEFT HEART CATH AND CORONARY ANGIOGRAPHY;  Surgeon: Laurier Nancy, MD;  Location: [facility] INVASIVE CV LAB;  Service: Cardiovascular;  Laterality: Right;  . NEPHRECTOMY Left     Social History   Socioeconomic History  . Marital status: Married    Spouse name: Not on file  . Number of children: Not on file  . Years of education: Not on file  . Highest education level: Not on file  Occupational History  . Not on file  Tobacco Use  . Smoking status: Never  . Smokeless tobacco: Never   Vaping Use  . Vaping status: Former  Substance and Sexual Activity  . Alcohol use: Not Currently  . Drug use: Not Currently  . Sexual activity: Not on file  Other Topics Concern  . Not on file  Social History Narrative  . Not on file   Social Determinants of Health   Financial Resource Strain: Not on file  Food Insecurity: Not on file  Transportation Needs: Not on file  Physical Activity: Not on file  Stress: Not on file  Social Connections: Not on file  Intimate Partner Violence: Not on file    Family History  Problem Relation Age of Onset  . Prostate cancer Neg Hx   . Bladder Cancer Neg Hx   . Kidney cancer Neg Hx     Allergies  Allergen Reactions  . Penicillins Rash    Has patient had a PCN reaction causing immediate rash, facial/tongue/throat swelling, SOB or lightheadedness with hypotension: Yes Has patient had a PCN reaction causing severe rash involving mucus membranes or skin necrosis: No Has patient had a PCN reaction that required hospitalization: No Has patient had a PCN reaction occurring within the last 10 years: No If all of the above answers are "NO", then may proceed with Cephalosporin use.     Review of Systems  Constitutional: Negative.   HENT: Negative.    Eyes: Negative. **Note De-Identified via Obfucation** Repiratory: Negative.    Cardiovacular: Negative.   Gatrointetinal: Negative.   Genitourinary: Negative.   Skin: Negative.   Neurological: Negative.   Endo/Heme/Allergie: Negative.        Objective:   BP 130/85   Pule 74   Ht 5\' 10"  (1.778 m)   Wt 174 lb 12.8 oz (79.3 kg)   SpO2 96%   BMI 25.08 kg/m   Vital:   09/27/22 1051  BP: 130/85  Pule: 74  Height: 5\' 10"  (1.778 m)  Weight: 174 lb 12.8 oz (79.3 kg)  SpO2: 96%  BMI (Calculated): 25.08    Phyical Exam Vital reviewed.  Contitutional:      Appearance: Normal appearance.  HENT:     Head: Normocephalic.     Left Ear: There i no impacted cerumen.     Noe: Noe normal.      Mouth/Throat:     Mouth: Mucou membrane are moit.     Pharynx: No poterior oropharyngeal erythema.  Eye:     Extraocular Movement: Extraocular movement intact.     Pupil: Pupil are equal, round, and reactive to light.  Cardiovacular:     Rate and Rhythm: Regular rhythm.     Chet Wall: PMI i not diplaced.     Pule: Normal pule.     Heart ound: Normal heart ound. No murmur heard. Pulmonary:     Effort: Pulmonary effort i normal.     Breath ound: Normal air entry. No rhonchi or rale.  Abdominal:     General: Abdomen i flat. Bowel ound are normal. There i no ditenion.     Palpation: Abdomen i oft. There i no hepatomegaly, plenomegaly or ma.     Tenderne: There i no abdominal tenderne.  Muculokeletal:        General: Normal range of motion.     Cervical back: Normal range of motion and neck upple.     Right lower leg: No edema.     Left lower leg: No edema.  Skin:    General: Skin i warm and dry.  Neurological:     General: No focal deficit preent.     Mental Statu: He i alert and oriented to peron, place, and time.     Cranial Nerve: No cranial nerve deficit.     Motor: No weakne.  Pychiatric:        Mood and Affect: Mood normal.        Behavior: Behavior normal.     Reult for order placed or performed in viit on 09/27/22  POCT CBG (Fating - Glucoe)  Reult Value Ref Range   Glucoe Fating, POC 137 (A) 70 - 99 mg/dL    Recent Reult (from the pat 2160 hour())  Hemoglobin A1c     Statu: Abnormal   Collection Time: 09/22/22  9:14 AM  Reult Value Ref Range   Hgb A1c MFr Bld 6.3 (H) 4.8 - 5.6 %    Comment:          Prediabete: 5.7 - 6.4          Diabete: >6.4          Glycemic control for adult with diabete: <7.0    Et. average glucoe Bld gHb Et-mCnc 134 mg/dL  Lipid panel     Statu: None   Collection Time: 09/22/22  9:14 AM  Reult Value Ref Range   Choleterol, Total 143 100 - 199 mg/dL    Triglyceride 87 0 - 149 mg/dL  HDL 59 >39 mg/dL   VLDL Cholesterol Cal 16 5 - 40 mg/dL   LDL Chol Calc (NIH) 68 0 - 99 mg/dL   Chol/HDL Ratio 2.4 0.0 - 5.0 ratio    Comment:                                   T. Chol/HDL Ratio                                             Men  Women                               1/2 Avg.Risk  3.4    3.3                                   Avg.Risk  5.0    4.4                                2X Avg.Risk  9.6    7.1                                3X Avg.Risk 23.4   11.0   Comprehensive metabolic panel     Status: Abnormal   Collection Time: 09/22/22  9:14 AM  Result Value Ref Range   Glucose 127 (H) 70 - 99 mg/dL   BUN 24 8 - 27 mg/dL   Creatinine, Ser 8.11 0.76 - 1.27 mg/dL   eGFR 80 >91 YN/WGN/5.62   BUN/Creatinine Ratio 24 10 - 24   Sodium 143 134 - 144 mmol/L   Potassium 4.9 3.5 - 5.2 mmol/L   Chloride 105 96 - 106 mmol/L   CO2 23 20 - 29 mmol/L   Calcium 9.1 8.6 - 10.2 mg/dL   Total Protein 6.1 6.0 - 8.5 g/dL   Albumin 4.3 3.8 - 4.8 g/dL   Globulin, Total 1.8 1.5 - 4.5 g/dL   Bilirubin Total 1.0 0.0 - 1.2 mg/dL   Alkaline Phosphatase 69 44 - 121 IU/L   AST 20 0 - 40 IU/L   ALT 14 0 - 44 IU/L  CBC With Diff/Platelet     Status: Abnormal   Collection Time: 09/22/22  9:14 AM  Result Value Ref Range   WBC 4.9 3.4 - 10.8 x10E3/uL   RBC 5.79 4.14 - 5.80 x10E6/uL   Hemoglobin 15.2 13.0 - 17.7 g/dL   Hematocrit 13.0 86.5 - 51.0 %   MCV 82 79 - 97 fL   MCH 26.3 (L) 26.6 - 33.0 pg   MCHC 31.9 31.5 - 35.7 g/dL   RDW 78.4 69.6 - 29.5 %   Platelets 161 150 - 450 x10E3/uL   Neutrophils 58 Not Estab. %   Lymphs 20 Not Estab. %   Monocytes 11 Not Estab. %   Eos 9 Not Estab. %   Basos 2 Not Estab. %   Neutrophils Absolute 2.8 1.4 - 7.0 x10E3/uL   Lymphocytes Absolute 1.0 0.7 - 3.1 x10E3/uL   Monocytes Absolute 0.5 0.1 - 0.9 x10E3/uL  EOS (ABSOLUTE) 0.4 0.0 - 0.4 x10E3/uL   Basophils Absolute 0.1 0.0 - 0.2 x10E3/uL   Immature Granulocytes 0 Not  Estab. %   Immature Grans (Abs) 0.0 0.0 - 0.1 x10E3/uL  POCT CBG (Fasting - Glucose)     Status: Abnormal   Collection Time: 09/27/22 10:57 AM  Result Value Ref Range   Glucose Fasting, POC 137 (A) 70 - 99 mg/dL      Assessment & Plan:  As per problem list.  Problem List Items Addressed This Visit       Endocrine   Type 2 diabetes mellitus without complication, without long-term current use of insulin (HCC) - Primary   Relevant Orders   POCT CBG (Fasting - Glucose) (Completed)   Hemoglobin A1c   Lipid panel   POC CREATINE & ALBUMIN,URINE     Other   Mixed hyperlipidemia   Relevant Orders   Lipid panel   Comprehensive metabolic panel    Return in about 4 months (around 01/27/2023) for fu with labs prior.   Total time spent: 20 minutes  Luna Fuse, MD  09/27/2022   This document may have been prepared by [facility] Voice Recognition software and as such may include unintentional dictation errors.

## 2022-11-16 ENCOUNTER — Other Ambulatory Visit: Payer: Self-pay | Admitting: Internal Medicine

## 2022-11-17 ENCOUNTER — Other Ambulatory Visit: Payer: Self-pay | Admitting: Cardiovascular Disease

## 2022-11-17 DIAGNOSIS — I10 Essential (primary) hypertension: Secondary | ICD-10-CM

## 2022-11-17 DIAGNOSIS — I2510 Atherosclerotic heart disease of native coronary artery without angina pectoris: Secondary | ICD-10-CM

## 2022-11-25 DIAGNOSIS — Z0100 Encounter for examination of eyes and vision without abnormal findings: Secondary | ICD-10-CM | POA: Diagnosis not present

## 2022-12-05 DIAGNOSIS — Z0100 Encounter for examination of eyes and vision without abnormal findings: Secondary | ICD-10-CM | POA: Diagnosis not present

## 2022-12-18 ENCOUNTER — Other Ambulatory Visit: Payer: Self-pay | Admitting: Internal Medicine

## 2023-01-23 ENCOUNTER — Other Ambulatory Visit: Payer: Self-pay

## 2023-01-23 DIAGNOSIS — E782 Mixed hyperlipidemia: Secondary | ICD-10-CM | POA: Diagnosis not present

## 2023-01-23 DIAGNOSIS — E119 Type 2 diabetes mellitus without complications: Secondary | ICD-10-CM

## 2023-01-24 LAB — COMPREHENSIVE METABOLIC PANEL
ALT: 12 [IU]/L (ref 0–44)
AST: 17 [IU]/L (ref 0–40)
Albumin: 4.4 g/dL (ref 3.8–4.8)
Alkaline Phosphatase: 78 [IU]/L (ref 44–121)
BUN/Creatinine Ratio: 23 (ref 10–24)
BUN: 21 mg/dL (ref 8–27)
Bilirubin Total: 1.1 mg/dL (ref 0.0–1.2)
CO2: 22 mmol/L (ref 20–29)
Calcium: 9 mg/dL (ref 8.6–10.2)
Chloride: 105 mmol/L (ref 96–106)
Creatinine, Ser: 0.92 mg/dL (ref 0.76–1.27)
Globulin, Total: 1.6 g/dL (ref 1.5–4.5)
Glucose: 130 mg/dL — ABNORMAL HIGH (ref 70–99)
Potassium: 4.4 mmol/L (ref 3.5–5.2)
Sodium: 142 mmol/L (ref 134–144)
Total Protein: 6 g/dL (ref 6.0–8.5)
eGFR: 89 mL/min/{1.73_m2} (ref >59)

## 2023-01-24 LAB — HEMOGLOBIN A1C
Est. average glucose Bld gHb Est-mCnc: 151 mg/dL
Hgb A1c MFr Bld: 6.9 % — ABNORMAL HIGH (ref 4.8–5.6)

## 2023-01-24 LAB — LIPID PANEL
Chol/HDL Ratio: 3.2 {ratio} (ref 0.0–5.0)
Cholesterol, Total: 179 mg/dL (ref 100–199)
HDL: 56 mg/dL (ref >39)
LDL Chol Calc (NIH): 106 mg/dL — ABNORMAL HIGH (ref 0–99)
Triglycerides: 91 mg/dL (ref 0–149)
VLDL Cholesterol Cal: 17 mg/dL (ref 5–40)

## 2023-01-25 ENCOUNTER — Encounter: Payer: Self-pay | Admitting: Internal Medicine

## 2023-01-25 ENCOUNTER — Ambulatory Visit (INDEPENDENT_AMBULATORY_CARE_PROVIDER_SITE_OTHER): Payer: Self-pay | Admitting: Internal Medicine

## 2023-01-25 VITALS — BP 130/80 | HR 80 | Ht 70.0 in | Wt 175.0 lb

## 2023-01-25 DIAGNOSIS — E119 Type 2 diabetes mellitus without complications: Secondary | ICD-10-CM | POA: Diagnosis not present

## 2023-01-25 DIAGNOSIS — Z0130 Encounter for examination of blood pressure without abnormal findings: Secondary | ICD-10-CM

## 2023-01-25 LAB — GLUCOSE, POCT (MANUAL RESULT ENTRY): POC Glucose: 161 mg/dL — AB (ref 70–99)

## 2023-01-25 NOTE — Progress Notes (Signed)
Established Patient Office Visit  Subjective:  Patient ID: Zachary Gordon, male    DOB: [birth year]  Age: 72 y.o. MRN: [id number]  Chief Complaint  Patient presents with   Follow-up    4 month    No new complaints, here for lab review and medication refills. Labs reviewed and notable for well controlled diabetes, A1c higher at target, lipids not at target with unremarkable cmp. Difficulty in affording Repatha and admits to dietary carb indiscretion.     No other concerns at this time.   Past Medical History:  Diagnosis Date   Chronic kidney disease    stones   Coronary artery disease    heart stent   Diabetes mellitus without complication (HCC)    Gout    Heart disease    Heart murmur    History of kidney stones    History of nephrectomy    Hyperlipidemia    Hypertension    Myocardial infarction [facility])     Past Surgical History:  Procedure Laterality Date   COLONOSCOPY WITH PROPOFOL N/A 01/10/2017   Procedure: COLONOSCOPY WITH PROPOFOL;  Surgeon: Toledo, Boykin Nearing, MD;  Location: [facility] ENDOSCOPY;  Service: Gastroenterology;  Laterality: N/A;   COLONOSCOPY WITH PROPOFOL N/A 02/20/2018   Procedure: COLONOSCOPY WITH PROPOFOL;  Surgeon: Toledo, Boykin Nearing, MD;  Location: [facility] ENDOSCOPY;  Service: Gastroenterology;  Laterality: N/A;   CORONARY ANGIOPLASTY WITH STENT PLACEMENT     EYE SURGERY     LEFT HEART CATH AND CORONARY ANGIOGRAPHY Right 01/24/2017   Procedure: LEFT HEART CATH AND CORONARY ANGIOGRAPHY;  Surgeon: Laurier Nancy, MD;  Location: [facility] INVASIVE CV LAB;  Service: Cardiovascular;  Laterality: Right;   NEPHRECTOMY Left     Social History   Socioeconomic History   Marital status: Married    Spouse name: Not on file   Number of children: Not on file   Years of education: Not on file   Highest education level: Not on file  Occupational History   Not on file  Tobacco Use   Smoking status: Never   Smokeless tobacco: Never  Vaping Use   Vaping status:  Former  Substance and Sexual Activity   Alcohol use: Not Currently   Drug use: Not Currently   Sexual activity: Not on file  Other Topics Concern   Not on file  Social History Narrative   Not on file   Social Determinants of Health   Financial Resource Strain: Not on file  Food Insecurity: Not on file  Transportation Needs: Not on file  Physical Activity: Not on file  Stress: Not on file  Social Connections: Not on file  Intimate Partner Violence: Not on file    Family History  Problem Relation Age of Onset   Prostate cancer Neg Hx    Bladder Cancer Neg Hx    Kidney cancer Neg Hx     Allergies  Allergen Reactions   Penicillins Rash    Has patient had a PCN reaction causing immediate rash, facial/tongue/throat swelling, SOB or lightheadedness with hypotension: Yes Has patient had a PCN reaction causing severe rash involving mucus membranes or skin necrosis: No Has patient had a PCN reaction that required hospitalization: No Has patient had a PCN reaction occurring within the last 10 years: No If all of the above answers are "NO", then may proceed with Cephalosporin use.     Outpatient Medications Prior to Visit  Medication Sig Note   amLODipine (NORVASC) 5 MG tablet Take 5 mg  by mouth daily.     aspirin EC 81 MG tablet Take 1 tablet (81 mg total) by mouth daily.    carvedilol (COREG) 6.25 MG tablet TAKE 1 TABLET TWICE DAILY    celecoxib (CELEBREX) 200 MG capsule TAKE 1 TO 2 CAPSULES EVERY DAY AS DIRECTED    clopidogrel (PLAVIX) 75 MG tablet TAKE 1 TABLET EVERY DAY    isosorbide mononitrate (IMDUR) 30 MG 24 hr tablet TAKE 1 TABLET EVERY DAY    lisinopril (ZESTRIL) 20 MG tablet TAKE 1 TABLET EVERY MORNING    REPATHA SURECLICK 140 MG/ML SOAJ Inject into the skin. 01/25/2023: Needs sample. Very expensive   rosuvastatin (CRESTOR) 40 MG tablet TAKE 1 TABLET EVERY DAY    triamcinolone cream (KENALOG) 0.1 % APPLY TO THE AFFECTED AREA(Arda Keadle) TWICE DAILY (Patient not taking:  Reported on 01/25/2023)    No facility-administered medications prior to visit.    Review of Systems  Constitutional: Negative.   HENT: Negative.    Eyes: Negative.   Respiratory: Negative.    Cardiovascular: Negative.   Gastrointestinal: Negative.   Genitourinary: Negative.   Skin: Negative.   Neurological: Negative.   Endo/Heme/Allergies: Negative.        Objective:   BP 130/80   Pulse 80   Ht 5\' 10"  (1.778 m)   Wt 175 lb (79.4 kg)   SpO2 97%   BMI 25.11 kg/m   Vitals:   01/25/23 1004  BP: 130/80  Pulse: 80  Height: 5\' 10"  (1.778 m)  Weight: 175 lb (79.4 kg)  SpO2: 97%  BMI (Calculated): 25.11    Physical Exam Vitals reviewed.  Constitutional:      Appearance: Normal appearance.  HENT:     Head: Normocephalic.     Left Ear: There is no impacted cerumen.     Nose: Nose normal.     Mouth/Throat:     Mouth: Mucous membranes are moist.     Pharynx: No posterior oropharyngeal erythema.  Eyes:     Extraocular Movements: Extraocular movements intact.     Pupils: Pupils are equal, round, and reactive to light.  Cardiovascular:     Rate and Rhythm: Regular rhythm.     Chest Wall: PMI is not displaced.     Pulses: Normal pulses.     Heart sounds: Normal heart sounds. No murmur heard. Pulmonary:     Effort: Pulmonary effort is normal.     Breath sounds: Normal air entry. No rhonchi or rales.  Abdominal:     General: Abdomen is flat. Bowel sounds are normal. There is no distension.     Palpations: Abdomen is soft. There is no hepatomegaly, splenomegaly or mass.     Tenderness: There is no abdominal tenderness.  Musculoskeletal:        General: Normal range of motion.     Cervical back: Normal range of motion and neck supple.     Right lower leg: No edema.     Left lower leg: No edema.  Skin:    General: Skin is warm and dry.  Neurological:     General: No focal deficit present.     Mental Status: He is alert and oriented to person, place, and time.      Cranial Nerves: No cranial nerve deficit.     Motor: No weakness.  Psychiatric:        Mood and Affect: Mood normal.        Behavior: Behavior normal.      Results for orders  placed or performed in visit on 01/25/23  POCT Glucose (CBG)  Result Value Ref Range   POC Glucose 161 (A) 70 - 99 mg/dl    Recent Results (from the past 2160 hour(Cliffard Hair))  Comprehensive metabolic panel     Status: Abnormal   Collection Time: 01/23/23  8:35 AM  Result Value Ref Range   Glucose 130 (H) 70 - 99 mg/dL   BUN 21 8 - 27 mg/dL   Creatinine, Ser 7.82 0.76 - 1.27 mg/dL   eGFR 89 >95 AO/ZHY/8.65   BUN/Creatinine Ratio 23 10 - 24   Sodium 142 134 - 144 mmol/L   Potassium 4.4 3.5 - 5.2 mmol/L   Chloride 105 96 - 106 mmol/L   CO2 22 20 - 29 mmol/L   Calcium 9.0 8.6 - 10.2 mg/dL   Total Protein 6.0 6.0 - 8.5 g/dL   Albumin 4.4 3.8 - 4.8 g/dL   Globulin, Total 1.6 1.5 - 4.5 g/dL   Bilirubin Total 1.1 0.0 - 1.2 mg/dL   Alkaline Phosphatase 78 44 - 121 IU/L   AST 17 0 - 40 IU/L   ALT 12 0 - 44 IU/L  Lipid panel     Status: Abnormal   Collection Time: 01/23/23  8:35 AM  Result Value Ref Range   Cholesterol, Total 179 100 - 199 mg/dL   Triglycerides 91 0 - 149 mg/dL   HDL 56 >78 mg/dL   VLDL Cholesterol Cal 17 5 - 40 mg/dL   LDL Chol Calc (NIH) 469 (H) 0 - 99 mg/dL   Chol/HDL Ratio 3.2 0.0 - 5.0 ratio    Comment:                                   T. Chol/HDL Ratio                                             Men  Women                               1/2 Avg.Risk  3.4    3.3                                   Avg.Risk  5.0    4.4                                2X Avg.Risk  9.6    7.1                                3X Avg.Risk 23.4   11.0   Hemoglobin A1c     Status: Abnormal   Collection Time: 01/23/23  8:35 AM  Result Value Ref Range   Hgb A1c MFr Bld 6.9 (H) 4.8 - 5.6 %    Comment:          Prediabetes: 5.7 - 6.4          Diabetes: >6.4          Glycemic control for adults with diabetes: <7.0  Est. average glucose Bld gHb Est-mCnc 151 mg/dL  POCT Glucose (CBG)     Status: Abnormal   Collection Time: 01/25/23 10:10 AM  Result Value Ref Range   POC Glucose 161 (A) 70 - 99 mg/dl      Assessment & Plan:  As per problem list.Stricter low calorie diet, low cholesterol and low fat diet and exercise as much as possible. Informed that meds may be resumed if his A1c rises further.  Problem List Items Addressed This Visit       Endocrine   Type 2 diabetes mellitus without complication, without long-term current use of insulin (HCC) - Primary   Relevant Orders   POCT Glucose (CBG) (Completed)    Return in about 3 months (around 04/25/2023) for fu with labs prior.   Total time spent: 20 minutes  Luna Fuse, MD  01/25/2023   This document may have been prepared by [facility] Voice Recognition software and as such may include unintentional dictation errors.

## 2023-03-09 ENCOUNTER — Other Ambulatory Visit: Payer: Self-pay

## 2023-03-31 ENCOUNTER — Other Ambulatory Visit: Payer: Self-pay

## 2023-03-31 DIAGNOSIS — I2510 Atherosclerotic heart disease of native coronary artery without angina pectoris: Secondary | ICD-10-CM

## 2023-03-31 DIAGNOSIS — N401 Enlarged prostate with lower urinary tract symptoms: Secondary | ICD-10-CM

## 2023-03-31 DIAGNOSIS — I10 Essential (primary) hypertension: Secondary | ICD-10-CM

## 2023-04-03 ENCOUNTER — Other Ambulatory Visit (HOSPITAL_COMMUNITY): Payer: Self-pay

## 2023-04-04 ENCOUNTER — Other Ambulatory Visit: Payer: Self-pay

## 2023-04-04 ENCOUNTER — Telehealth: Payer: Self-pay

## 2023-04-04 NOTE — Telephone Encounter (Signed)
Pt lm asking for call back about meds going to a mail order with [facility]? He left a number of a pharmacy that I attached to his chart but this is for [facility], not sure if this is a mail order or not, pt would like a call back

## 2023-04-07 MED ORDER — CLOPIDOGREL BISULFATE 75 MG PO TABS
75.0000 mg | ORAL_TABLET | Freq: Every day | ORAL | 3 refills | Status: DC
  Filled 2023-04-10: qty 90, 90d supply, fill #0
  Filled 2023-05-04 – 2023-06-28 (×3): qty 90, 90d supply, fill #1
  Filled 2023-10-01: qty 90, 90d supply, fill #2
  Filled 2023-10-25 – 2024-01-01 (×3): qty 90, 90d supply, fill #3

## 2023-04-07 MED ORDER — CARVEDILOL 6.25 MG PO TABS
6.2500 mg | ORAL_TABLET | Freq: Two times a day (BID) | ORAL | 3 refills | Status: DC
  Filled 2023-04-10: qty 180, 90d supply, fill #0
  Filled 2023-05-04 – 2023-06-28 (×3): qty 180, 90d supply, fill #1
  Filled 2023-10-01: qty 180, 90d supply, fill #2
  Filled 2023-10-25 – 2024-01-01 (×3): qty 180, 90d supply, fill #3

## 2023-04-07 MED ORDER — AMLODIPINE BESYLATE 5 MG PO TABS
5.0000 mg | ORAL_TABLET | Freq: Every day | ORAL | 1 refills | Status: DC
  Filled 2023-04-10: qty 90, 90d supply, fill #0
  Filled 2023-05-04 – 2023-06-28 (×3): qty 90, 90d supply, fill #1

## 2023-04-07 MED ORDER — REPATHA SURECLICK 140 MG/ML ~~LOC~~ SOAJ
140.0000 mg | SUBCUTANEOUS | 0 refills | Status: DC
  Filled 2023-04-10: qty 2, 28d supply, fill #0
  Filled 2023-05-04: qty 2, 28d supply, fill #1
  Filled 2023-06-01 – 2023-06-28 (×2): qty 2, 28d supply, fill #2

## 2023-04-07 MED ORDER — ISOSORBIDE MONONITRATE ER 30 MG PO TB24
30.0000 mg | ORAL_TABLET | Freq: Every day | ORAL | 3 refills | Status: DC
  Filled 2023-04-10: qty 90, 90d supply, fill #0
  Filled 2023-05-04 – 2023-06-28 (×3): qty 90, 90d supply, fill #1
  Filled 2023-10-01: qty 90, 90d supply, fill #2
  Filled 2023-10-25 – 2024-01-01 (×3): qty 90, 90d supply, fill #3

## 2023-04-10 ENCOUNTER — Other Ambulatory Visit (HOSPITAL_COMMUNITY): Payer: Self-pay

## 2023-04-10 ENCOUNTER — Other Ambulatory Visit: Payer: Self-pay

## 2023-04-10 MED ORDER — ROSUVASTATIN CALCIUM 40 MG PO TABS
40.0000 mg | ORAL_TABLET | Freq: Every day | ORAL | 1 refills | Status: DC
  Filled 2023-04-10: qty 90, 90d supply, fill #0
  Filled 2023-05-04 – 2023-06-28 (×3): qty 90, 90d supply, fill #1

## 2023-04-10 MED ORDER — LISINOPRIL 10 MG PO TABS
10.0000 mg | ORAL_TABLET | Freq: Every morning | ORAL | 1 refills | Status: DC
  Filled 2023-04-10: qty 90, 90d supply, fill #0
  Filled 2023-05-04 – 2023-06-28 (×3): qty 90, 90d supply, fill #1

## 2023-04-11 ENCOUNTER — Other Ambulatory Visit (HOSPITAL_COMMUNITY): Payer: Self-pay

## 2023-04-26 ENCOUNTER — Ambulatory Visit: Payer: PRIVATE HEALTH INSURANCE | Admitting: Internal Medicine

## 2023-05-04 ENCOUNTER — Other Ambulatory Visit (HOSPITAL_COMMUNITY): Payer: Self-pay

## 2023-05-12 DIAGNOSIS — Z961 Presence of intraocular lens: Secondary | ICD-10-CM | POA: Diagnosis not present

## 2023-05-12 DIAGNOSIS — H26492 Other secondary cataract, left eye: Secondary | ICD-10-CM | POA: Diagnosis not present

## 2023-05-12 DIAGNOSIS — H04202 Unspecified epiphora, left lacrimal gland: Secondary | ICD-10-CM | POA: Diagnosis not present

## 2023-05-12 DIAGNOSIS — H43813 Vitreous degeneration, bilateral: Secondary | ICD-10-CM | POA: Diagnosis not present

## 2023-06-01 ENCOUNTER — Other Ambulatory Visit (HOSPITAL_COMMUNITY): Payer: Self-pay

## 2023-06-02 ENCOUNTER — Other Ambulatory Visit

## 2023-06-02 DIAGNOSIS — E782 Mixed hyperlipidemia: Secondary | ICD-10-CM

## 2023-06-02 DIAGNOSIS — E119 Type 2 diabetes mellitus without complications: Secondary | ICD-10-CM | POA: Diagnosis not present

## 2023-06-03 LAB — HEMOGLOBIN A1C
Est. average glucose Bld gHb Est-mCnc: 151 mg/dL
Hgb A1c MFr Bld: 6.9 % — ABNORMAL HIGH (ref 4.8–5.6)

## 2023-06-03 LAB — COMPREHENSIVE METABOLIC PANEL WITH GFR
ALT: 9 IU/L (ref 0–44)
AST: 15 IU/L (ref 0–40)
Albumin: 4.2 g/dL (ref 3.8–4.8)
Alkaline Phosphatase: 82 IU/L (ref 44–121)
BUN/Creatinine Ratio: 24 (ref 10–24)
BUN: 24 mg/dL (ref 8–27)
Bilirubin Total: 1 mg/dL (ref 0.0–1.2)
CO2: 20 mmol/L (ref 20–29)
Calcium: 8.9 mg/dL (ref 8.6–10.2)
Chloride: 106 mmol/L (ref 96–106)
Creatinine, Ser: 0.99 mg/dL (ref 0.76–1.27)
Globulin, Total: 1.8 g/dL (ref 1.5–4.5)
Glucose: 133 mg/dL — ABNORMAL HIGH (ref 70–99)
Potassium: 4.1 mmol/L (ref 3.5–5.2)
Sodium: 146 mmol/L — ABNORMAL HIGH (ref 134–144)
Total Protein: 6 g/dL (ref 6.0–8.5)
eGFR: 81 mL/min/{1.73_m2} (ref >59)

## 2023-06-03 LAB — LIPID PANEL
Chol/HDL Ratio: 1.8 ratio (ref 0.0–5.0)
Cholesterol, Total: 96 mg/dL — ABNORMAL LOW (ref 100–199)
HDL: 54 mg/dL (ref >39)
LDL Chol Calc (NIH): 24 mg/dL (ref 0–99)
Triglycerides: 91 mg/dL (ref 0–149)
VLDL Cholesterol Cal: 18 mg/dL (ref 5–40)

## 2023-06-06 ENCOUNTER — Ambulatory Visit (INDEPENDENT_AMBULATORY_CARE_PROVIDER_SITE_OTHER): Payer: PRIVATE HEALTH INSURANCE | Admitting: Internal Medicine

## 2023-06-06 VITALS — BP 130/64 | HR 70 | Temp 96.8°F | Ht 70.0 in | Wt 173.0 lb

## 2023-06-06 DIAGNOSIS — E782 Mixed hyperlipidemia: Secondary | ICD-10-CM | POA: Diagnosis not present

## 2023-06-06 DIAGNOSIS — I2510 Atherosclerotic heart disease of native coronary artery without angina pectoris: Secondary | ICD-10-CM | POA: Diagnosis not present

## 2023-06-06 DIAGNOSIS — I10 Essential (primary) hypertension: Secondary | ICD-10-CM | POA: Diagnosis not present

## 2023-06-06 DIAGNOSIS — E119 Type 2 diabetes mellitus without complications: Secondary | ICD-10-CM | POA: Diagnosis not present

## 2023-06-06 NOTE — Progress Notes (Signed)
 Established Patient Office Visit  Subjective:  Patient ID: Zachary Gordon, male    DOB: 07/01/1950  Age: 73 y.o. MRN: [id number]  Chief Complaint  Patient presents with   Follow-up    3 months follow up    No new complaints, here for lab review and medication refills. Labs reviewed and notable for well controlled diabetes, A1c  barely at target, lipids at target while cmp notable for hypernatremia. Denies any hypoglycemic episodes and home bg readings have been at target.     No other concerns at this time.   Past Medical History:  Diagnosis Date   Chronic kidney disease    stones   Coronary artery disease    heart stent   Diabetes mellitus without complication (HCC)    Gout    Heart disease    Heart murmur    History of kidney stones    History of nephrectomy    Hyperlipidemia    Hypertension    Myocardial infarction [facility])     Past Surgical History:  Procedure Laterality Date   COLONOSCOPY WITH PROPOFOL N/A 01/10/2017   Procedure: COLONOSCOPY WITH PROPOFOL;  Surgeon: Toledo, Boykin Nearing, MD;  Location: [facility] END[facility];  Service: Gastroenterology;  Laterality: N/A;   COLONOSCOPY WITH PROPOFOL N/A 02/20/2018   Procedure: COLONOSCOPY WITH PROPOFOL;  Surgeon: Toledo, Boykin Nearing, MD;  Location: [facility];  Service: Gastroenterology;  Laterality: N/A;   CORONARY ANGIOPLASTY WITH STENT PLACEMENT     EYE SURGERY     LEFT HEART CATH AND CORONARY ANGIOGRAPHY Right 01/24/2017   Procedure: LEFT HEART CATH AND CORONARY ANGIOGRAPHY;  Surgeon: Laurier Nancy, MD;  Location: [facility] CV LAB;  Service: Cardiovascular;  Laterality: Right;   NEPHRECTOMY Left     Social History   Socioeconomic History   Marital status: Married    Spouse name: Not on file   Number of children: Not on file   Years of education: Not on file   Highest education level: Not on file  Occupational History   Not on file  Tobacco Use   Smoking status: Never   Smokeless tobacco: Never  Vaping  Use   Vaping status: Former  Substance and Sexual Activity   Alcohol use: Not Currently   Drug use: Not Currently   Sexual activity: Not on file  Other Topics Concern   Not on file  Social History Narrative   Not on file   Social Drivers of Health   Financial Resource Strain: Not on file  Food Insecurity: Not on file  Transportation Needs: Not on file  Physical Activity: Not on file  Stress: Not on file  Social Connections: Not on file  Intimate Partner Violence: Not on file    Family History  Problem Relation Age of Onset   Prostate cancer Neg Hx    Bladder Cancer Neg Hx    Kidney cancer Neg Hx     Allergies  Allergen Reactions   Penicillins Rash    Has patient had a PCN reaction causing immediate rash, facial/tongue/throat swelling, SOB or lightheadedness with hypotension: Yes Has patient had a PCN reaction causing severe rash involving mucus membranes or skin necrosis: No Has patient had a PCN reaction that required hospitalization: No Has patient had a PCN reaction occurring within the last 10 years: No If all of the above answers are "NO", then may proceed with Cephalosporin use.     Outpatient Medications Prior to Visit  Medication Sig   amLODipine (NORVASC) 5  MG tablet Take 1 tablet (5 mg total) by mouth daily.   aspirin EC 81 MG tablet Take 1 tablet (81 mg total) by mouth daily.   carvedilol (COREG) 6.25 MG tablet Take 1 tablet (6.25 mg total) by mouth 2 (two) times daily.   celecoxib (CELEBREX) 200 MG capsule TAKE 1 TO 2 CAPSULES EVERY DAY AS DIRECTED   clopidogrel (PLAVIX) 75 MG tablet Take 1 tablet (75 mg total) by mouth daily.   isosorbide mononitrate (IMDUR) 30 MG 24 hr tablet Take 1 tablet (30 mg total) by mouth daily.   lisinopril (ZESTRIL) 10 MG tablet Take 1 tablet (10 mg total) by mouth every morning.   lisinopril (ZESTRIL) 20 MG tablet TAKE 1 TABLET EVERY MORNING   REPATHA SURECLICK 140 MG/ML SOAJ Inject 140 mg into the skin every 14 (fourteen)  days.   rosuvastatin (CRESTOR) 40 MG tablet TAKE 1 TABLET EVERY DAY   rosuvastatin (CRESTOR) 40 MG tablet Take 1 tablet (40 mg total) by mouth daily.   triamcinolone cream (KENALOG) 0.1 % APPLY TO THE AFFECTED AREA(Tonnette Zwiebel) TWICE DAILY (Patient not taking: Reported on 01/25/2023)   No facility-administered medications prior to visit.    Review of Systems  Constitutional: Negative.   HENT: Negative.    Eyes: Negative.   Respiratory: Negative.    Cardiovascular: Negative.   Gastrointestinal: Negative.   Genitourinary: Negative.   Skin: Negative.   Neurological: Negative.   Endo/Heme/Allergies: Negative.        Objective:   BP 130/64   Pulse 70   Temp (!) 96.8 F (36 C)   Ht 5\' 10"  (1.778 m)   Wt 173 lb (78.5 kg)   SpO2 99%   BMI 24.82 kg/m   Vitals:   06/06/23 1539  BP: 130/64  Pulse: 70  Temp: (!) 96.8 F (36 C)  Height: 5\' 10"  (1.778 m)  Weight: 173 lb (78.5 kg)  SpO2: 99%  BMI (Calculated): 24.82    Physical Exam Vitals reviewed.  Constitutional:      Appearance: Normal appearance.  HENT:     Head: Normocephalic.     Left Ear: There is no impacted cerumen.     Nose: Nose normal.     Mouth/Throat:     Mouth: Mucous membranes are moist.     Pharynx: No posterior oropharyngeal erythema.  Eyes:     Extraocular Movements: Extraocular movements intact.     Pupils: Pupils are equal, round, and reactive to light.  Cardiovascular:     Rate and Rhythm: Regular rhythm.     Chest Wall: PMI is not displaced.     Pulses: Normal pulses.     Heart sounds: Normal heart sounds. No murmur heard. Pulmonary:     Effort: Pulmonary effort is normal.     Breath sounds: Normal air entry. No rhonchi or rales.  Abdominal:     General: Abdomen is flat. Bowel sounds are normal. There is no distension.     Palpations: Abdomen is soft. There is no hepatomegaly, splenomegaly or mass.     Tenderness: There is no abdominal tenderness.  Musculoskeletal:        General: Normal range  of motion.     Cervical back: Normal range of motion and neck supple.     Right lower leg: No edema.     Left lower leg: No edema.  Skin:    General: Skin is warm and dry.  Neurological:     General: No focal deficit present.  Mental Status: He is alert and oriented to person, place, and time.     Cranial Nerves: No cranial nerve deficit.     Motor: No weakness.  Psychiatric:        Mood and Affect: Mood normal.        Behavior: Behavior normal.      No results found for any visits on 06/06/23.  Recent Results (from the past 2160 hours)  Comprehensive metabolic panel     Status: Abnormal   Collection Time: 06/02/23  8:18 AM  Result Value Ref Range   Glucose 133 (H) 70 - 99 mg/dL   BUN 24 8 - 27 mg/dL   Creatinine, Ser 1.61 0.76 - 1.27 mg/dL   eGFR 81 >09 UE/AVW/0.98   BUN/Creatinine Ratio 24 10 - 24   Sodium 146 (H) 134 - 144 mmol/L   Potassium 4.1 3.5 - 5.2 mmol/L   Chloride 106 96 - 106 mmol/L   CO2 20 20 - 29 mmol/L   Calcium 8.9 8.6 - 10.2 mg/dL   Total Protein 6.0 6.0 - 8.5 g/dL   Albumin 4.2 3.8 - 4.8 g/dL   Globulin, Total 1.8 1.5 - 4.5 g/dL   Bilirubin Total 1.0 0.0 - 1.2 mg/dL   Alkaline Phosphatase 82 44 - 121 IU/L   AST 15 0 - 40 IU/L   ALT 9 0 - 44 IU/L  Lipid panel     Status: Abnormal   Collection Time: 06/02/23  8:18 AM  Result Value Ref Range   Cholesterol, Total 96 (L) 100 - 199 mg/dL   Triglycerides 91 0 - 149 mg/dL   HDL 54 >11 mg/dL   VLDL Cholesterol Cal 18 5 - 40 mg/dL   LDL Chol Calc (NIH) 24 0 - 99 mg/dL   Chol/HDL Ratio 1.8 0.0 - 5.0 ratio    Comment:                                   T. Chol/HDL Ratio                                             Men  Women                               1/2 Avg.Risk  3.4    3.3                                   Avg.Risk  5.0    4.4                                2X Avg.Risk  9.6    7.1                                3X Avg.Risk 23.4   11.0   Hemoglobin A1c     Status: Abnormal   Collection Time:  06/02/23  8:18 AM  Result Value Ref Range   Hgb A1c MFr Bld 6.9 (H) 4.8 - 5.6 %    Comment:  Prediabetes: 5.7 - 6.4          Diabetes: >6.4          Glycemic control for adults with diabetes: <7.0    Est. average glucose Bld gHb Est-mCnc 151 mg/dL      Assessment & Plan:  As per problem list. Stricter low calorie diet, low cholesterol and low fat diet and exercise as much as possible.  Problem List Items Addressed This Visit       Cardiovascular and Mediastinum   Coronary artery disease involving native coronary artery of native heart without angina pectoris   Primary hypertension   Relevant Orders   CBC With Diff/Platelet     Endocrine   Type 2 diabetes mellitus without complication, without long-term current use of insulin (HCC) - Primary   Relevant Orders   Hemoglobin A1c     Other   Mixed hyperlipidemia   Relevant Orders   CK   Lipid panel    Return in about 4 months (around 10/06/2023) for awv with labs prior.   Total time spent: 20 minutes  Arzella Bitters, MD  06/06/2023   This document may have been prepared by [facility] Voice Recognition software and as such may include unintentional dictation errors.

## 2023-06-28 ENCOUNTER — Other Ambulatory Visit (HOSPITAL_COMMUNITY): Payer: Self-pay

## 2023-06-28 ENCOUNTER — Other Ambulatory Visit: Payer: Self-pay

## 2023-09-15 DIAGNOSIS — Z961 Presence of intraocular lens: Secondary | ICD-10-CM | POA: Diagnosis not present

## 2023-09-15 DIAGNOSIS — H43813 Vitreous degeneration, bilateral: Secondary | ICD-10-CM | POA: Diagnosis not present

## 2023-09-27 ENCOUNTER — Other Ambulatory Visit

## 2023-09-27 DIAGNOSIS — I10 Essential (primary) hypertension: Secondary | ICD-10-CM | POA: Diagnosis not present

## 2023-09-27 DIAGNOSIS — E782 Mixed hyperlipidemia: Secondary | ICD-10-CM | POA: Diagnosis not present

## 2023-09-27 LAB — CBC WITH DIFF/PLATELET
Basophils Absolute: 0.1 x10E3/uL (ref 0.0–0.2)
Basos: 2 %
EOS (ABSOLUTE): 0.3 x10E3/uL (ref 0.0–0.4)
Eos: 5 %
Hematocrit: 48.8 % (ref 37.5–51.0)
Hemoglobin: 15.6 g/dL (ref 13.0–17.7)
Immature Grans (Abs): 0 x10E3/uL (ref 0.0–0.1)
Immature Granulocytes: 0 %
Lymphocytes Absolute: 1.3 x10E3/uL (ref 0.7–3.1)
Lymphs: 24 %
MCH: 26.5 pg — ABNORMAL LOW (ref 26.6–33.0)
MCHC: 32 g/dL (ref 31.5–35.7)
MCV: 83 fL (ref 79–97)
Monocytes Absolute: 0.6 x10E3/uL (ref 0.1–0.9)
Monocytes: 11 %
Neutrophils Absolute: 3.2 x10E3/uL (ref 1.4–7.0)
Neutrophils: 58 %
Platelets: 180 x10E3/uL (ref 150–450)
RBC: 5.88 x10E6/uL — ABNORMAL HIGH (ref 4.14–5.80)
RDW: 14 % (ref 11.6–15.4)
WBC: 5.5 x10E3/uL (ref 3.4–10.8)

## 2023-09-28 LAB — COMPREHENSIVE METABOLIC PANEL WITH GFR
ALT: 13 IU/L (ref 0–44)
AST: 17 IU/L (ref 0–40)
Albumin: 4.2 g/dL (ref 3.8–4.8)
Alkaline Phosphatase: 80 IU/L (ref 44–121)
BUN/Creatinine Ratio: 19 (ref 10–24)
BUN: 21 mg/dL (ref 8–27)
Bilirubin Total: 0.8 mg/dL (ref 0.0–1.2)
CO2: 21 mmol/L (ref 20–29)
Calcium: 9 mg/dL (ref 8.6–10.2)
Chloride: 105 mmol/L (ref 96–106)
Creatinine, Ser: 1.09 mg/dL (ref 0.76–1.27)
Globulin, Total: 1.7 g/dL (ref 1.5–4.5)
Glucose: 124 mg/dL — ABNORMAL HIGH (ref 70–99)
Potassium: 4.5 mmol/L (ref 3.5–5.2)
Sodium: 141 mmol/L (ref 134–144)
Total Protein: 5.9 g/dL — ABNORMAL LOW (ref 6.0–8.5)
eGFR: 72 mL/min/1.73 (ref >59)

## 2023-10-01 ENCOUNTER — Other Ambulatory Visit (HOSPITAL_COMMUNITY): Payer: Self-pay

## 2023-10-01 ENCOUNTER — Other Ambulatory Visit: Payer: Self-pay | Admitting: Cardiovascular Disease

## 2023-10-01 ENCOUNTER — Other Ambulatory Visit: Payer: Self-pay | Admitting: Internal Medicine

## 2023-10-01 DIAGNOSIS — N401 Enlarged prostate with lower urinary tract symptoms: Secondary | ICD-10-CM

## 2023-10-02 ENCOUNTER — Other Ambulatory Visit (HOSPITAL_COMMUNITY): Payer: Self-pay

## 2023-10-02 ENCOUNTER — Other Ambulatory Visit: Payer: Self-pay

## 2023-10-02 MED ORDER — REPATHA SURECLICK 140 MG/ML ~~LOC~~ SOAJ
140.0000 mg | SUBCUTANEOUS | 0 refills | Status: AC
  Filled 2023-10-02: qty 2, 28d supply, fill #0
  Filled 2023-10-25: qty 2, 28d supply, fill #1
  Filled 2024-01-01: qty 2, 28d supply, fill #2

## 2023-10-02 MED ORDER — AMLODIPINE BESYLATE 5 MG PO TABS
5.0000 mg | ORAL_TABLET | Freq: Every day | ORAL | 0 refills | Status: DC
  Filled 2023-10-02: qty 30, 30d supply, fill #0

## 2023-10-02 MED FILL — Lisinopril Tab 10 MG: ORAL | 90 days supply | Qty: 90 | Fill #0 | Status: AC

## 2023-10-02 MED FILL — Rosuvastatin Calcium Tab 40 MG: ORAL | 90 days supply | Qty: 90 | Fill #0 | Status: AC

## 2023-10-06 ENCOUNTER — Ambulatory Visit: Admitting: Internal Medicine

## 2023-10-09 ENCOUNTER — Ambulatory Visit: Payer: Self-pay | Admitting: Internal Medicine

## 2023-10-09 ENCOUNTER — Ambulatory Visit (INDEPENDENT_AMBULATORY_CARE_PROVIDER_SITE_OTHER): Admitting: Internal Medicine

## 2023-10-09 ENCOUNTER — Ambulatory Visit: Admitting: Internal Medicine

## 2023-10-09 VITALS — BP 132/86 | HR 76 | Temp 98.2°F | Ht 70.0 in | Wt 174.0 lb

## 2023-10-09 DIAGNOSIS — N401 Enlarged prostate with lower urinary tract symptoms: Secondary | ICD-10-CM | POA: Diagnosis not present

## 2023-10-09 DIAGNOSIS — E119 Type 2 diabetes mellitus without complications: Secondary | ICD-10-CM | POA: Diagnosis not present

## 2023-10-09 DIAGNOSIS — Z0130 Encounter for examination of blood pressure without abnormal findings: Secondary | ICD-10-CM

## 2023-10-09 DIAGNOSIS — E782 Mixed hyperlipidemia: Secondary | ICD-10-CM

## 2023-10-09 DIAGNOSIS — R3912 Poor urinary stream: Secondary | ICD-10-CM | POA: Diagnosis not present

## 2023-10-09 LAB — POC CREATINE & ALBUMIN,URINE
Creatinine, POC: 80 mg/dL
Microalbumin Ur, POC: 200 mg/L

## 2023-10-09 LAB — POCT CBG (FASTING - GLUCOSE)-MANUAL ENTRY: Glucose Fasting, POC: 92 mg/dL (ref 70–99)

## 2023-10-09 NOTE — Progress Notes (Signed)
 Established Patient Office Visit  Subjective:  Patient ID: Zachary Gordon, male    DOB: 1951/02/07  Age: 73 y.o. MRN: [id number]  Chief Complaint  Patient presents with   Follow-up    4 month follow up    C/o stiffness in his hands worse in the am for the last several months. Failed to have previsit labs done.     No other concerns at this time.   Past Medical History:  Diagnosis Date   Chronic kidney disease    stones   Coronary artery disease    heart stent   Diabetes mellitus without complication (HCC)    Gout    Heart disease    Heart murmur    History of kidney stones    History of nephrectomy    Hyperlipidemia    Hypertension    Myocardial infarction [facility]     Past Surgical History:  Procedure Laterality Date   COLONOSCOPY WITH PROPOFOL  N/A 01/10/2017   Procedure: COLONOSCOPY WITH PROPOFOL ;  Surgeon: Toledo, Ladell POUR, MD;  Location: [facility] ENDOSCOPY;  Service: Gastroenterology;  Laterality: N/A;   COLONOSCOPY WITH PROPOFOL  N/A 02/20/2018   Procedure: COLONOSCOPY WITH PROPOFOL ;  Surgeon: Toledo, Ladell POUR, MD;  Location: [facility] ENDOSCOPY;  Service: Gastroenterology;  Laterality: N/A;   CORONARY ANGIOPLASTY WITH STENT PLACEMENT     EYE SURGERY     LEFT HEART CATH AND CORONARY ANGIOGRAPHY Right 01/24/2017   Procedure: LEFT HEART CATH AND CORONARY ANGIOGRAPHY;  Surgeon: Fernand Denyse LABOR, MD;  Location: [facility] INVASIVE CV LAB;  Service: Cardiovascular;  Laterality: Right;   NEPHRECTOMY Left     Social History   Socioeconomic History   Marital status: Married    Spouse name: Not on file   Number of children: Not on file   Years of education: Not on file   Highest education level: Not on file  Occupational History   Not on file  Tobacco Use   Smoking status: Never   Smokeless tobacco: Never  Vaping Use   Vaping status: Former  Substance and Sexual Activity   Alcohol use: Not Currently   Drug use: Not Currently   Sexual activity: Not on file  Other Topics  Concern   Not on file  Social History Narrative   Not on file   Social Drivers of Health   Financial Resource Strain: Not on file  Food Insecurity: Not on file  Transportation Needs: Not on file  Physical Activity: Not on file  Stress: Not on file  Social Connections: Not on file  Intimate Partner Violence: Not on file    Family History  Problem Relation Age of Onset   Prostate cancer Neg Hx    Bladder Cancer Neg Hx    Kidney cancer Neg Hx     Allergies  Allergen Reactions   Penicillins Rash    Has patient had a PCN reaction causing immediate rash, facial/tongue/throat swelling, SOB or lightheadedness with hypotension: Yes Has patient had a PCN reaction causing severe rash involving mucus membranes or skin necrosis: No Has patient had a PCN reaction that required hospitalization: No Has patient had a PCN reaction occurring within the last 10 years: No If all of the above answers are NO, then may proceed with Cephalosporin use.     Outpatient Medications Prior to Visit  Medication Sig   amLODipine  (NORVASC ) 5 MG tablet Take 1 tablet (5 mg total) by mouth daily.   aspirin  EC 81 MG tablet Take 1 tablet (81 mg  total) by mouth daily.   carvedilol  (COREG ) 6.25 MG tablet Take 1 tablet (6.25 mg total) by mouth 2 (two) times daily.   clopidogrel  (PLAVIX ) 75 MG tablet Take 1 tablet (75 mg total) by mouth daily.   isosorbide  mononitrate (IMDUR ) 30 MG 24 hr tablet Take 1 tablet (30 mg total) by mouth daily.   lisinopril  (ZESTRIL ) 20 MG tablet TAKE 1 TABLET EVERY MORNING   REPATHA  SURECLICK 140 MG/ML SOAJ Inject 140 mg into the skin every 14 (fourteen) days.   rosuvastatin  (CRESTOR ) 40 MG tablet TAKE 1 TABLET EVERY DAY   rosuvastatin  (CRESTOR ) 40 MG tablet Take 1 tablet (40 mg total) by mouth daily.   rosuvastatin  (CRESTOR ) 40 MG tablet Take 1 tablet (40 mg total) by mouth daily.   celecoxib (CELEBREX) 200 MG capsule TAKE 1 TO 2 CAPSULES EVERY DAY AS DIRECTED   lisinopril   (ZESTRIL ) 10 MG tablet Take 1 tablet (10 mg total) by mouth every morning. (Patient not taking: Reported on 10/09/2023)   triamcinolone cream (KENALOG) 0.1 % APPLY TO THE AFFECTED AREA(Amorina Doerr) TWICE DAILY (Patient not taking: Reported on 10/09/2023)   No facility-administered medications prior to visit.    Review of Systems  Constitutional: Negative.   HENT: Negative.    Eyes: Negative.   Respiratory: Negative.    Cardiovascular: Negative.   Gastrointestinal: Negative.   Genitourinary: Negative.   Skin: Negative.   Neurological: Negative.   Endo/Heme/Allergies: Negative.        Objective:   BP 132/86   Pulse 76   Temp 98.2 F (36.8 C)   Ht 5' 10 (1.778 m)   Wt 174 lb (78.9 kg)   SpO2 96%   BMI 24.97 kg/m   Vitals:   10/09/23 1526  BP: 132/86  Pulse: 76  Temp: 98.2 F (36.8 C)  Height: 5' 10 (1.778 m)  Weight: 174 lb (78.9 kg)  SpO2: 96%  BMI (Calculated): 24.97    Physical Exam Vitals reviewed.  Constitutional:      Appearance: Normal appearance.  HENT:     Head: Normocephalic.     Left Ear: There is no impacted cerumen.     Nose: Nose normal.     Mouth/Throat:     Mouth: Mucous membranes are moist.     Pharynx: No posterior oropharyngeal erythema.  Eyes:     Extraocular Movements: Extraocular movements intact.     Pupils: Pupils are equal, round, and reactive to light.  Cardiovascular:     Rate and Rhythm: Regular rhythm.     Chest Wall: PMI is not displaced.     Pulses: Normal pulses.     Heart sounds: Normal heart sounds. No murmur heard. Pulmonary:     Effort: Pulmonary effort is normal.     Breath sounds: Normal air entry. No rhonchi or rales.  Abdominal:     General: Abdomen is flat. Bowel sounds are normal. There is no distension.     Palpations: Abdomen is soft. There is no hepatomegaly, splenomegaly or mass.     Tenderness: There is no abdominal tenderness.  Musculoskeletal:        General: Normal range of motion.     Cervical back: Normal  range of motion and neck supple.     Right lower leg: No edema.     Left lower leg: No edema.  Skin:    General: Skin is warm and dry.  Neurological:     General: No focal deficit present.     Mental Status:  He is alert and oriented to person, place, and time.     Cranial Nerves: No cranial nerve deficit.     Motor: No weakness.  Psychiatric:        Mood and Affect: Mood normal.        Behavior: Behavior normal.      Results for orders placed or performed in visit on 10/09/23  POCT CBG (Fasting - Glucose)  Result Value Ref Range   Glucose Fasting, POC 92 70 - 99 mg/dL    Recent Results (from the past 2160 hours)  CBC With Diff/Platelet     Status: Abnormal   Collection Time: 09/27/23  8:30 AM  Result Value Ref Range   WBC 5.5 3.4 - 10.8 x10E3/uL   RBC 5.88 (H) 4.14 - 5.80 x10E6/uL   Hemoglobin 15.6 13.0 - 17.7 g/dL   Hematocrit 51.1 62.4 - 51.0 %   MCV 83 79 - 97 fL   MCH 26.5 (L) 26.6 - 33.0 pg   MCHC 32.0 31.5 - 35.7 g/dL   RDW 85.9 88.3 - 84.5 %   Platelets 180 150 - 450 x10E3/uL   Neutrophils 58 Not Estab. %   Lymphs 24 Not Estab. %   Monocytes 11 Not Estab. %   Eos 5 Not Estab. %   Basos 2 Not Estab. %   Neutrophils Absolute 3.2 1.4 - 7.0 x10E3/uL   Lymphocytes Absolute 1.3 0.7 - 3.1 x10E3/uL   Monocytes Absolute 0.6 0.1 - 0.9 x10E3/uL   EOS (ABSOLUTE) 0.3 0.0 - 0.4 x10E3/uL   Basophils Absolute 0.1 0.0 - 0.2 x10E3/uL   Immature Granulocytes 0 Not Estab. %   Immature Grans (Abs) 0.0 0.0 - 0.1 x10E3/uL  Comprehensive metabolic panel     Status: Abnormal   Collection Time: 09/27/23  8:30 AM  Result Value Ref Range   Glucose 124 (H) 70 - 99 mg/dL   BUN 21 8 - 27 mg/dL   Creatinine, Ser 8.90 0.76 - 1.27 mg/dL   eGFR 72 >40 fO/fpw/8.26   BUN/Creatinine Ratio 19 10 - 24   Sodium 141 134 - 144 mmol/L   Potassium 4.5 3.5 - 5.2 mmol/L   Chloride 105 96 - 106 mmol/L   CO2 21 20 - 29 mmol/L   Calcium  9.0 8.6 - 10.2 mg/dL   Total Protein 5.9 (L) 6.0 - 8.5 g/dL    Albumin 4.2 3.8 - 4.8 g/dL   Globulin, Total 1.7 1.5 - 4.5 g/dL   Bilirubin Total 0.8 0.0 - 1.2 mg/dL   Alkaline Phosphatase 80 44 - 121 IU/L   AST 17 0 - 40 IU/L   ALT 13 0 - 44 IU/L  POCT CBG (Fasting - Glucose)     Status: Abnormal   Collection Time: 10/09/23  3:38 PM  Result Value Ref Range   Glucose Fasting, POC 92 70 - 99 mg/dL      Assessment & Plan:  Jossue was seen today for follow-up.  Type 2 diabetes mellitus without complication, without long-term current use of insulin (HCC) -     POCT CBG (Fasting - Glucose) -     Hemoglobin A1c -     Hemoglobin A1c  Mixed hyperlipidemia -     Lipid panel -     CK  Benign prostatic hyperplasia with weak urinary stream -     PSA    Problem List Items Addressed This Visit       Endocrine   Type 2 diabetes mellitus without complication, without  long-term current use of insulin (HCC) - Primary   Relevant Orders   POCT CBG (Fasting - Glucose) (Completed)     Other   Mixed hyperlipidemia   Other Visit Diagnoses       Benign prostatic hyperplasia with weak urinary stream       Relevant Orders   PSA       Return in about 1 month (around 11/09/2023) for awv with labs prior.   Total time spent: 20 minutes  Sherrill Cinderella Perry, MD  10/09/2023   This document may have been prepared by [facility] Voice Recognition software and as such may include unintentional dictation errors.

## 2023-10-10 NOTE — Progress Notes (Signed)
   10/10/2023  Patient ID: Abram Hildegard Gin, male   DOB: 12-Mar-1950, 73 y.o.   MRN: [id number]  Pharmacy Quality Measure Review  This patient is appearing on a report for being at risk of failing the adherence measure for hypertension (ACEi/ARB) medications this calendar year.   Medication: Lisinopril  10mg  Last fill date: 10/03/23 for 90 day supply  Insurance report was not up to date. No action needed at this time.   Jon VEAR Lindau, PharmD Clinical Pharmacist [phone]

## 2023-10-18 ENCOUNTER — Other Ambulatory Visit (HOSPITAL_COMMUNITY): Payer: Self-pay

## 2023-10-25 ENCOUNTER — Other Ambulatory Visit: Payer: Self-pay | Admitting: Cardiology

## 2023-10-25 ENCOUNTER — Other Ambulatory Visit: Payer: Self-pay

## 2023-10-25 ENCOUNTER — Other Ambulatory Visit (HOSPITAL_COMMUNITY): Payer: Self-pay

## 2023-10-25 MED FILL — Rosuvastatin Calcium Tab 40 MG: ORAL | 90 days supply | Qty: 90 | Fill #1 | Status: CN

## 2023-10-25 MED FILL — Lisinopril Tab 10 MG: ORAL | 90 days supply | Qty: 90 | Fill #1 | Status: CN

## 2023-10-26 ENCOUNTER — Other Ambulatory Visit: Payer: Self-pay

## 2023-10-26 ENCOUNTER — Other Ambulatory Visit

## 2023-10-26 DIAGNOSIS — E782 Mixed hyperlipidemia: Secondary | ICD-10-CM | POA: Diagnosis not present

## 2023-10-26 DIAGNOSIS — N401 Enlarged prostate with lower urinary tract symptoms: Secondary | ICD-10-CM | POA: Diagnosis not present

## 2023-10-26 DIAGNOSIS — R3912 Poor urinary stream: Secondary | ICD-10-CM | POA: Diagnosis not present

## 2023-10-26 DIAGNOSIS — E119 Type 2 diabetes mellitus without complications: Secondary | ICD-10-CM | POA: Diagnosis not present

## 2023-10-26 LAB — HEMOGLOBIN A1C
Est. average glucose Bld gHb Est-mCnc: 143 mg/dL
Hgb A1c MFr Bld: 6.6 % — ABNORMAL HIGH (ref 4.8–5.6)

## 2023-10-27 ENCOUNTER — Other Ambulatory Visit (HOSPITAL_COMMUNITY): Payer: Self-pay

## 2023-10-27 ENCOUNTER — Other Ambulatory Visit: Payer: Self-pay

## 2023-10-27 LAB — LIPID PANEL
Chol/HDL Ratio: 2.8 ratio (ref 0.0–5.0)
Cholesterol, Total: 138 mg/dL (ref 100–199)
HDL: 50 mg/dL (ref >39)
LDL Chol Calc (NIH): 68 mg/dL (ref 0–99)
Triglycerides: 107 mg/dL (ref 0–149)
VLDL Cholesterol Cal: 20 mg/dL (ref 5–40)

## 2023-10-27 LAB — CK: Total CK: 53 U/L (ref 41–331)

## 2023-10-27 LAB — PSA: Prostate Specific Ag, Serum: 2.1 ng/mL (ref 0.0–4.0)

## 2023-10-27 MED ORDER — AMLODIPINE BESYLATE 5 MG PO TABS
5.0000 mg | ORAL_TABLET | Freq: Every day | ORAL | 0 refills | Status: DC
  Filled 2023-10-27: qty 30, 30d supply, fill #0

## 2023-11-13 ENCOUNTER — Ambulatory Visit (INDEPENDENT_AMBULATORY_CARE_PROVIDER_SITE_OTHER): Admitting: Internal Medicine

## 2023-11-13 ENCOUNTER — Other Ambulatory Visit: Payer: Self-pay

## 2023-11-13 ENCOUNTER — Other Ambulatory Visit (HOSPITAL_COMMUNITY): Payer: Self-pay

## 2023-11-13 VITALS — BP 136/78 | HR 65 | Temp 98.2°F | Ht 70.0 in | Wt 173.4 lb

## 2023-11-13 DIAGNOSIS — E782 Mixed hyperlipidemia: Secondary | ICD-10-CM | POA: Diagnosis not present

## 2023-11-13 DIAGNOSIS — E119 Type 2 diabetes mellitus without complications: Secondary | ICD-10-CM

## 2023-11-13 DIAGNOSIS — I10 Essential (primary) hypertension: Secondary | ICD-10-CM

## 2023-11-13 DIAGNOSIS — Z0001 Encounter for general adult medical examination with abnormal findings: Secondary | ICD-10-CM | POA: Diagnosis not present

## 2023-11-13 DIAGNOSIS — M1A4710 Other secondary chronic gout, right ankle and foot, without tophus (tophi): Secondary | ICD-10-CM

## 2023-11-13 DIAGNOSIS — M109 Gout, unspecified: Secondary | ICD-10-CM | POA: Insufficient documentation

## 2023-11-13 LAB — POCT CBG (FASTING - GLUCOSE)-MANUAL ENTRY: Glucose Fasting, POC: 161 mg/dL — AB (ref 70–99)

## 2023-11-13 MED ORDER — AMLODIPINE BESYLATE 5 MG PO TABS
5.0000 mg | ORAL_TABLET | Freq: Every day | ORAL | 1 refills | Status: DC
  Filled 2023-11-13 – 2023-11-22 (×2): qty 90, 90d supply, fill #0

## 2023-11-13 NOTE — Progress Notes (Signed)
 Established Patient Office Visit  Subjective:  Patient ID: Zachary Gordon, male    DOB: October 29, 1950  Age: 73 y.o. MRN: [id number]  Chief Complaint  Patient presents with   Annual Exam    1 month AWV lab results    No new complaints, here for AWV refer to quality metrics and scanned documents. Also here for lab review and medication refills. Labs reviewed and notable for well controlled diabetes, A1c improved and remains at target, lipids at target with unremarkable psa. Denies any hypoglycemic episodes and home bg readings have been at target. C/o gout flares with high protein diet.     No other concerns at this time.   Past Medical History:  Diagnosis Date   Chronic kidney disease    stones   Coronary artery disease    heart stent   Diabetes mellitus without complication (HCC)    Gout    Heart disease    Heart murmur    History of kidney stones    History of nephrectomy    Hyperlipidemia    Hypertension    Myocardial infarction [facility])     Past Surgical History:  Procedure Laterality Date   COLONOSCOPY WITH PROPOFOL  N/A 01/10/2017   Procedure: COLONOSCOPY WITH PROPOFOL ;  Surgeon: Toledo, Ladell POUR, MD;  Location: [facility] ENDOSCOPY;  Service: Gastroenterology;  Laterality: N/A;   COLONOSCOPY WITH PROPOFOL  N/A 02/20/2018   Procedure: COLONOSCOPY WITH PROPOFOL ;  Surgeon: Toledo, Ladell POUR, MD;  Location: [facility] ENDOSCOPY;  Service: Gastroenterology;  Laterality: N/A;   CORONARY ANGIOPLASTY WITH STENT PLACEMENT     EYE SURGERY     LEFT HEART CATH AND CORONARY ANGIOGRAPHY Right 01/24/2017   Procedure: LEFT HEART CATH AND CORONARY ANGIOGRAPHY;  Surgeon: Fernand Denyse LABOR, MD;  Location: [facility] INVASIVE CV LAB;  Service: Cardiovascular;  Laterality: Right;   NEPHRECTOMY Left     Social History   Socioeconomic History   Marital status: Married    Spouse name: Not on file   Number of children: Not on file   Years of education: Not on file   Highest education level: Not on file   Occupational History   Not on file  Tobacco Use   Smoking status: Never   Smokeless tobacco: Never  Vaping Use   Vaping status: Former  Substance and Sexual Activity   Alcohol use: Not Currently   Drug use: Not Currently   Sexual activity: Not on file  Other Topics Concern   Not on file  Social History Narrative   Not on file   Social Drivers of Health   Financial Resource Strain: Not on file  Food Insecurity: Not on file  Transportation Needs: Not on file  Physical Activity: Not on file  Stress: Not on file  Social Connections: Not on file  Intimate Partner Violence: Not on file    Family History  Problem Relation Age of Onset   Prostate cancer Neg Hx    Bladder Cancer Neg Hx    Kidney cancer Neg Hx     Allergies  Allergen Reactions   Penicillins Rash    Has patient had a PCN reaction causing immediate rash, facial/tongue/throat swelling, SOB or lightheadedness with hypotension: Yes Has patient had a PCN reaction causing severe rash involving mucus membranes or skin necrosis: No Has patient had a PCN reaction that required hospitalization: No Has patient had a PCN reaction occurring within the last 10 years: No If all of the above answers are NO, then may proceed with  Cephalosporin use.     Outpatient Medications Prior to Visit  Medication Sig   aspirin  EC 81 MG tablet Take 1 tablet (81 mg total) by mouth daily.   carvedilol  (COREG ) 6.25 MG tablet Take 1 tablet (6.25 mg total) by mouth 2 (two) times daily.   celecoxib (CELEBREX) 200 MG capsule TAKE 1 TO 2 CAPSULES EVERY DAY AS DIRECTED   clopidogrel  (PLAVIX ) 75 MG tablet Take 1 tablet (75 mg total) by mouth daily.   isosorbide  mononitrate (IMDUR ) 30 MG 24 hr tablet Take 1 tablet (30 mg total) by mouth daily.   lisinopril  (ZESTRIL ) 10 MG tablet Take 1 tablet (10 mg total) by mouth every morning.   REPATHA  SURECLICK 140 MG/ML SOAJ Inject 140 mg into the skin every 14 (fourteen) days.   rosuvastatin  (CRESTOR )  40 MG tablet TAKE 1 TABLET EVERY DAY   [DISCONTINUED] amLODipine  (NORVASC ) 5 MG tablet Take 1 tablet (5 mg total) by mouth daily.   lisinopril  (ZESTRIL ) 20 MG tablet TAKE 1 TABLET EVERY MORNING   rosuvastatin  (CRESTOR ) 40 MG tablet Take 1 tablet (40 mg total) by mouth daily.   rosuvastatin  (CRESTOR ) 40 MG tablet Take 1 tablet (40 mg total) by mouth daily.   triamcinolone cream (KENALOG) 0.1 % APPLY TO THE AFFECTED AREA(Legacy Carrender) TWICE DAILY (Patient not taking: Reported on 11/13/2023)   No facility-administered medications prior to visit.    Review of Systems  Constitutional:  Positive for weight loss (lost 1 lb).  HENT: Negative.    Eyes: Negative.   Respiratory: Negative.    Cardiovascular: Negative.   Gastrointestinal: Negative.   Genitourinary: Negative.   Skin: Negative.   Neurological: Negative.   Endo/Heme/Allergies: Negative.        Objective:   BP 136/78   Pulse 65   Temp 98.2 F (36.8 C)   Ht 5' 10 (1.778 m)   Wt 173 lb 6.4 oz (78.7 kg)   SpO2 98%   BMI 24.88 kg/m   Vitals:   11/13/23 1352  BP: 136/78  Pulse: 65  Temp: 98.2 F (36.8 C)  Height: 5' 10 (1.778 m)  Weight: 173 lb 6.4 oz (78.7 kg)  SpO2: 98%  BMI (Calculated): 24.88    Physical Exam Vitals reviewed.  Constitutional:      Appearance: Normal appearance.  HENT:     Head: Normocephalic.     Left Ear: There is no impacted cerumen.     Nose: Nose normal.     Mouth/Throat:     Mouth: Mucous membranes are moist.     Pharynx: No posterior oropharyngeal erythema.  Eyes:     Extraocular Movements: Extraocular movements intact.     Pupils: Pupils are equal, round, and reactive to light.  Cardiovascular:     Rate and Rhythm: Regular rhythm.     Chest Wall: PMI is not displaced.     Pulses: Normal pulses.     Heart sounds: Normal heart sounds. No murmur heard. Pulmonary:     Effort: Pulmonary effort is normal.     Breath sounds: Normal air entry. No rhonchi or rales.  Abdominal:     General:  Abdomen is flat. Bowel sounds are normal. There is no distension.     Palpations: Abdomen is soft. There is no hepatomegaly, splenomegaly or mass.     Tenderness: There is no abdominal tenderness.  Musculoskeletal:        General: Normal range of motion.     Cervical back: Normal range of motion and neck  supple.     Right lower leg: No edema.     Left lower leg: No edema.  Skin:    General: Skin is warm and dry.  Neurological:     General: No focal deficit present.     Mental Status: He is alert and oriented to person, place, and time.     Cranial Nerves: No cranial nerve deficit.     Motor: No weakness.  Psychiatric:        Mood and Affect: Mood normal.        Behavior: Behavior normal.      Results for orders placed or performed in visit on 11/13/23  POCT CBG (Fasting - Glucose)  Result Value Ref Range   Glucose Fasting, POC 161 (A) 70 - 99 mg/dL    Recent Results (from the past 2160 hours)  CBC With Diff/Platelet     Status: Abnormal   Collection Time: 09/27/23  8:30 AM  Result Value Ref Range   WBC 5.5 3.4 - 10.8 x10E3/uL   RBC 5.88 (H) 4.14 - 5.80 x10E6/uL   Hemoglobin 15.6 13.0 - 17.7 g/dL   Hematocrit 51.1 62.4 - 51.0 %   MCV 83 79 - 97 fL   MCH 26.5 (L) 26.6 - 33.0 pg   MCHC 32.0 31.5 - 35.7 g/dL   RDW 85.9 88.3 - 84.5 %   Platelets 180 150 - 450 x10E3/uL   Neutrophils 58 Not Estab. %   Lymphs 24 Not Estab. %   Monocytes 11 Not Estab. %   Eos 5 Not Estab. %   Basos 2 Not Estab. %   Neutrophils Absolute 3.2 1.4 - 7.0 x10E3/uL   Lymphocytes Absolute 1.3 0.7 - 3.1 x10E3/uL   Monocytes Absolute 0.6 0.1 - 0.9 x10E3/uL   EOS (ABSOLUTE) 0.3 0.0 - 0.4 x10E3/uL   Basophils Absolute 0.1 0.0 - 0.2 x10E3/uL   Immature Granulocytes 0 Not Estab. %   Immature Grans (Abs) 0.0 0.0 - 0.1 x10E3/uL  Comprehensive metabolic panel     Status: Abnormal   Collection Time: 09/27/23  8:30 AM  Result Value Ref Range   Glucose 124 (H) 70 - 99 mg/dL   BUN 21 8 - 27 mg/dL    Creatinine, Ser 8.90 0.76 - 1.27 mg/dL   eGFR 72 >40 fO/fpw/8.26   BUN/Creatinine Ratio 19 10 - 24   Sodium 141 134 - 144 mmol/L   Potassium 4.5 3.5 - 5.2 mmol/L   Chloride 105 96 - 106 mmol/L   CO2 21 20 - 29 mmol/L   Calcium  9.0 8.6 - 10.2 mg/dL   Total Protein 5.9 (L) 6.0 - 8.5 g/dL   Albumin 4.2 3.8 - 4.8 g/dL   Globulin, Total 1.7 1.5 - 4.5 g/dL   Bilirubin Total 0.8 0.0 - 1.2 mg/dL   Alkaline Phosphatase 80 44 - 121 IU/L   AST 17 0 - 40 IU/L   ALT 13 0 - 44 IU/L  POCT CBG (Fasting - Glucose)     Status: Abnormal   Collection Time: 10/09/23  3:38 PM  Result Value Ref Range   Glucose Fasting, POC 92 70 - 99 mg/dL  POC CREATINE & ALBUMIN,URINE     Status: Abnormal   Collection Time: 10/09/23  4:25 PM  Result Value Ref Range   Microalbumin Ur, POC 200 mg/L   Creatinine, POC 80 mg/dL   Albumin/Creatinine Ratio, Urine, POC 30-300   Lipid panel     Status: None   Collection Time: 10/26/23  8:47 AM  Result Value Ref Range   Cholesterol, Total 138 100 - 199 mg/dL   Triglycerides 892 0 - 149 mg/dL   HDL 50 >60 mg/dL   VLDL Cholesterol Cal 20 5 - 40 mg/dL   LDL Chol Calc (NIH) 68 0 - 99 mg/dL   Chol/HDL Ratio 2.8 0.0 - 5.0 ratio    Comment:                                   T. Chol/HDL Ratio                                             Men  Women                               1/2 Avg.Risk  3.4    3.3                                   Avg.Risk  5.0    4.4                                2X Avg.Risk  9.6    7.1                                3X Avg.Risk 23.4   11.0   Hemoglobin A1c     Status: Abnormal   Collection Time: 10/26/23  8:48 AM  Result Value Ref Range   Hgb A1c MFr Bld 6.6 (H) 4.8 - 5.6 %    Comment:          Prediabetes: 5.7 - 6.4          Diabetes: >6.4          Glycemic control for adults with diabetes: <7.0    Est. average glucose Bld gHb Est-mCnc 143 mg/dL  CK     Status: None   Collection Time: 10/26/23  8:48 AM  Result Value Ref Range   Total CK 53 41 -  331 U/L  PSA     Status: None   Collection Time: 10/26/23  8:49 AM  Result Value Ref Range   Prostate Specific Ag, Serum 2.1 0.0 - 4.0 ng/mL    Comment: Roche ECLIA methodology. According to the American Urological Association, Serum PSA should decrease and remain at undetectable levels after radical prostatectomy. The AUA defines biochemical recurrence as an initial PSA value 0.2 ng/mL or greater followed by a subsequent confirmatory PSA value 0.2 ng/mL or greater. Values obtained with different assay methods or kits cannot be used interchangeably. Results cannot be interpreted as absolute evidence of the presence or absence of malignant disease.   POCT CBG (Fasting - Glucose)     Status: Abnormal   Collection Time: 11/13/23  1:59 PM  Result Value Ref Range   Glucose Fasting, POC 161 (A) 70 - 99 mg/dL      Assessment & Plan:  Zachary Gordon was seen today for annual exam.  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  Dispense: 90 tablet; Refill: 1    Problem List Items Addressed This Visit       Cardiovascular and Mediastinum   Primary hypertension   Relevant Medications   amLODipine  (NORVASC ) 5 MG tablet     Endocrine   Type 2 diabetes mellitus without complication, without long-term current use of insulin (HCC) - Primary   Relevant Orders   POCT CBG (Fasting - Glucose) (Completed)     Other   Mixed hyperlipidemia   Relevant Medications   amLODipine  (NORVASC ) 5 MG tablet   Gout    Return in 4 months (on 03/14/2024) for fu with labs prior.   Total time spent: 30 minutes  Sherrill Cinderella Perry, MD  11/13/2023   This document may have been  prepared by [facility] Voice Recognition software and as such may include unintentional dictation errors.

## 2023-11-22 ENCOUNTER — Other Ambulatory Visit (HOSPITAL_COMMUNITY): Payer: Self-pay

## 2023-11-22 ENCOUNTER — Other Ambulatory Visit: Payer: Self-pay

## 2023-11-22 MED FILL — Lisinopril Tab 10 MG: ORAL | 90 days supply | Qty: 90 | Fill #1 | Status: CN

## 2023-11-22 MED FILL — Rosuvastatin Calcium Tab 40 MG: ORAL | 90 days supply | Qty: 90 | Fill #1 | Status: CN

## 2023-11-25 ENCOUNTER — Other Ambulatory Visit (HOSPITAL_COMMUNITY): Payer: Self-pay

## 2024-01-01 ENCOUNTER — Other Ambulatory Visit: Payer: Self-pay

## 2024-01-01 ENCOUNTER — Other Ambulatory Visit (HOSPITAL_COMMUNITY): Payer: Self-pay

## 2024-01-01 MED FILL — Rosuvastatin Calcium Tab 40 MG: ORAL | 90 days supply | Qty: 90 | Fill #1 | Status: AC

## 2024-01-01 MED FILL — Lisinopril Tab 10 MG: ORAL | 90 days supply | Qty: 90 | Fill #1 | Status: AC

## 2024-01-09 ENCOUNTER — Other Ambulatory Visit (HOSPITAL_COMMUNITY): Payer: Self-pay

## 2024-01-09 NOTE — Progress Notes (Signed)
   01/09/2024  Patient ID: Zachary Gordon, male   DOB: November 27, 1950, 73 y.o.   MRN: [id number]  Pharmacy Quality Measure Review  This patient is appearing on a report for being at risk of failing the adherence measure for hypertension (ACEi/ARB) medications this calendar year.   Medication: Lisinopril  Last fill date: 01/02/24 for 90 day supply  Insurance report was not up to date. No action needed at this time.   Jon VEAR Lindau, PharmD Clinical Pharmacist [phone]

## 2024-01-10 ENCOUNTER — Encounter: Payer: Self-pay | Admitting: Pharmacist

## 2024-01-10 ENCOUNTER — Other Ambulatory Visit: Payer: Self-pay

## 2024-01-15 ENCOUNTER — Other Ambulatory Visit: Payer: Self-pay

## 2024-03-13 ENCOUNTER — Other Ambulatory Visit

## 2024-03-13 ENCOUNTER — Other Ambulatory Visit: Payer: Self-pay | Admitting: Internal Medicine

## 2024-03-13 DIAGNOSIS — I2510 Atherosclerotic heart disease of native coronary artery without angina pectoris: Secondary | ICD-10-CM

## 2024-03-13 DIAGNOSIS — I10 Essential (primary) hypertension: Secondary | ICD-10-CM

## 2024-03-13 LAB — HEMOGLOBIN A1C
Est. average glucose Bld gHb Est-mCnc: 140 mg/dL
Hgb A1c MFr Bld: 6.5 % — ABNORMAL HIGH (ref 4.8–5.6)

## 2024-03-13 MED ORDER — CLOPIDOGREL BISULFATE 75 MG PO TABS: 75.0000 mg | ORAL_TABLET | Freq: Every day | ORAL | 0 refills | Status: AC

## 2024-03-13 MED ORDER — ISOSORBIDE MONONITRATE ER 30 MG PO TB24: 30.0000 mg | ORAL_TABLET | Freq: Every day | ORAL | 0 refills | Status: AC

## 2024-03-13 MED ORDER — ROSUVASTATIN CALCIUM 40 MG PO TABS: 40.0000 mg | ORAL_TABLET | Freq: Every day | ORAL | 0 refills | Status: AC

## 2024-03-13 MED ORDER — CARVEDILOL 6.25 MG PO TABS: 6.2500 mg | ORAL_TABLET | Freq: Two times a day (BID) | ORAL | 0 refills | Status: AC

## 2024-03-13 MED ORDER — AMLODIPINE BESYLATE 5 MG PO TABS: 5.0000 mg | ORAL_TABLET | Freq: Every day | ORAL | 0 refills | Status: AC

## 2024-03-14 LAB — LIPID PANEL
Chol/HDL Ratio: 3.1 ratio (ref 0.0–5.0)
Cholesterol, Total: 166 mg/dL (ref 100–199)
HDL: 54 mg/dL
LDL Chol Calc (NIH): 95 mg/dL (ref 0–99)
Triglycerides: 91 mg/dL (ref 0–149)
VLDL Cholesterol Cal: 17 mg/dL (ref 5–40)

## 2024-03-20 ENCOUNTER — Encounter: Payer: Self-pay | Admitting: Internal Medicine

## 2024-03-20 ENCOUNTER — Ambulatory Visit: Payer: Self-pay | Admitting: Internal Medicine

## 2024-03-20 VITALS — BP 162/88 | HR 77 | Temp 98.9°F | Ht 69.0 in | Wt 174.6 lb

## 2024-03-20 DIAGNOSIS — I2510 Atherosclerotic heart disease of native coronary artery without angina pectoris: Secondary | ICD-10-CM

## 2024-03-20 DIAGNOSIS — Z8679 Personal history of other diseases of the circulatory system: Secondary | ICD-10-CM | POA: Diagnosis not present

## 2024-03-20 DIAGNOSIS — E782 Mixed hyperlipidemia: Secondary | ICD-10-CM

## 2024-03-20 DIAGNOSIS — R7303 Prediabetes: Secondary | ICD-10-CM

## 2024-03-20 DIAGNOSIS — E1165 Type 2 diabetes mellitus with hyperglycemia: Secondary | ICD-10-CM

## 2024-03-20 DIAGNOSIS — E119 Type 2 diabetes mellitus without complications: Secondary | ICD-10-CM

## 2024-03-20 DIAGNOSIS — I10 Essential (primary) hypertension: Secondary | ICD-10-CM | POA: Diagnosis not present

## 2024-03-20 LAB — POCT CBG (FASTING - GLUCOSE)-MANUAL ENTRY: Glucose Fasting, POC: 133 mg/dL — AB (ref 70–99)

## 2024-03-20 MED ORDER — LISINOPRIL 20 MG PO TABS: 20.0000 mg | ORAL_TABLET | Freq: Every morning | ORAL | 1 refills | Status: AC

## 2024-03-20 MED ORDER — EZETIMIBE 10 MG PO TABS: 10.0000 mg | ORAL_TABLET | Freq: Every day | ORAL | 1 refills | Status: AC

## 2024-03-20 NOTE — Progress Notes (Signed)
 "  Established Patient Office Visit  Subjective:  Patient ID: Zachary Gordon, male    DOB: 01-05-1951  Age: 74 y.o. MRN: [id number]  Chief Complaint  Patient presents with   Results    4 month lab results. Meds haven't came to pharmacy and he needs them     No new complaints, here for lab review and medication refills. Labs reviewed and notable for well controlled diabetes, A1c improved and at target, lipids not at target as unable to afford Repatha . Denies any hypoglycemic episodes and home bg readings have been at target. BP elevated as he didn't take his bp meds this am.     No other concerns at this time.   Past Medical History:  Diagnosis Date   Chronic kidney disease    stones   Coronary artery disease    heart stent   Diabetes mellitus without complication (HCC)    Gout    Heart disease    Heart murmur    History of kidney stones    History of nephrectomy    Hyperlipidemia    Hypertension    Myocardial infarction [facility])     Past Surgical History:  Procedure Laterality Date   COLONOSCOPY WITH PROPOFOL  N/A 01/10/2017   Procedure: COLONOSCOPY WITH PROPOFOL ;  Surgeon: Toledo, Ladell POUR, MD;  Location: [facility] ENDOSCOPY;  Service: Gastroenterology;  Laterality: N/A;   COLONOSCOPY WITH PROPOFOL  N/A 02/20/2018   Procedure: COLONOSCOPY WITH PROPOFOL ;  Surgeon: Toledo, Ladell POUR, MD;  Location: [facility] ENDOSCOPY;  Service: Gastroenterology;  Laterality: N/A;   CORONARY ANGIOPLASTY WITH STENT PLACEMENT     EYE SURGERY     LEFT HEART CATH AND CORONARY ANGIOGRAPHY Right 01/24/2017   Procedure: LEFT HEART CATH AND CORONARY ANGIOGRAPHY;  Surgeon: Fernand Denyse LABOR, MD;  Location: [facility] INVASIVE CV LAB;  Service: Cardiovascular;  Laterality: Right;   NEPHRECTOMY Left     Social History   Socioeconomic History   Marital status: Married    Spouse name: Not on file   Number of children: Not on file   Years of education: Not on file   Highest education level: Not on file  Occupational  History   Not on file  Tobacco Use   Smoking status: Never   Smokeless tobacco: Never  Vaping Use   Vaping status: Former  Substance and Sexual Activity   Alcohol use: Not Currently   Drug use: Not Currently   Sexual activity: Not on file  Other Topics Concern   Not on file  Social History Narrative   Not on file   Social Drivers of Health   Tobacco Use: Low Risk (03/20/2024)   Patient History    Smoking Tobacco Use: Never    Smokeless Tobacco Use: Never    Passive Exposure: Not on file  Financial Resource Strain: Not on file  Food Insecurity: Not on file  Transportation Needs: Not on file  Physical Activity: Not on file  Stress: Not on file  Social Connections: Not on file  Intimate Partner Violence: Not on file  Depression (PHQ2-9): Low Risk (06/27/2022)   Depression (PHQ2-9)    PHQ-2 Score: 2  Alcohol Screen: Not on file  Housing: Not on file  Utilities: Not on file  Health Literacy: Not on file    Family History  Problem Relation Age of Onset   Prostate cancer Neg Hx    Bladder Cancer Neg Hx    Kidney cancer Neg Hx     Allergies[1]  Show/hide medication list[2]  Review of Systems  Constitutional:  Negative for weight loss.  HENT: Negative.    Eyes: Negative.   Respiratory: Negative.    Cardiovascular: Negative.   Gastrointestinal: Negative.   Genitourinary: Negative.   Skin: Negative.   Neurological: Negative.   Endo/Heme/Allergies: Negative.        Objective:   BP (!) 162/88   Pulse 77   Temp 98.9 F (37.2 C)   Ht 5' 9 (1.753 m)   Wt 174 lb 9.6 oz (79.2 kg)   SpO2 97%   BMI 25.78 kg/m   Vitals:   03/20/24 0952  BP: (!) 162/88  Pulse: 77  Temp: 98.9 F (37.2 C)  Height: 5' 9 (1.753 m)  Weight: 174 lb 9.6 oz (79.2 kg)  SpO2: 97%  BMI (Calculated): 25.77    Physical Exam Vitals reviewed.  Constitutional:      Appearance: Normal appearance.  HENT:     Head: Normocephalic.     Left Ear: There is no impacted cerumen.      Nose: Nose normal.     Mouth/Throat:     Mouth: Mucous membranes are moist.     Pharynx: No posterior oropharyngeal erythema.  Eyes:     Extraocular Movements: Extraocular movements intact.     Pupils: Pupils are equal, round, and reactive to light.  Cardiovascular:     Rate and Rhythm: Regular rhythm.     Chest Wall: PMI is not displaced.     Pulses: Normal pulses.     Heart sounds: Murmur heard.     Crescendo decrescendo systolic murmur is present with a grade of 3/6.  Pulmonary:     Effort: Pulmonary effort is normal.     Breath sounds: Normal air entry. No rhonchi or rales.  Abdominal:     General: Abdomen is flat. Bowel sounds are normal. There is no distension.     Palpations: Abdomen is soft. There is no hepatomegaly, splenomegaly or mass.     Tenderness: There is no abdominal tenderness.  Musculoskeletal:        General: Normal range of motion.     Cervical back: Normal range of motion and neck supple.     Right lower leg: No edema.     Left lower leg: No edema.  Skin:    General: Skin is warm and dry.  Neurological:     General: No focal deficit present.     Mental Status: He is alert and oriented to person, place, and time.     Cranial Nerves: No cranial nerve deficit.     Motor: No weakness.  Psychiatric:        Mood and Affect: Mood normal.        Behavior: Behavior normal.      Results for orders placed or performed in visit on 03/20/24  POCT CBG (Fasting - Glucose)  Result Value Ref Range   Glucose Fasting, POC 133 (A) 70 - 99 mg/dL    Recent Results (from the past 2160 hours)  Lipid panel     Status: None   Collection Time: 03/13/24  8:45 AM  Result Value Ref Range   Cholesterol, Total 166 100 - 199 mg/dL   Triglycerides 91 0 - 149 mg/dL   HDL 54 >60 mg/dL   VLDL Cholesterol Cal 17 5 - 40 mg/dL   LDL Chol Calc (NIH) 95 0 - 99 mg/dL   Chol/HDL Ratio 3.1 0.0 - 5.0 ratio    Comment:  T. Chol/HDL Ratio                                              Men  Women                               1/2 Avg.Risk  3.4    3.3                                   Avg.Risk  5.0    4.4                                2X Avg.Risk  9.6    7.1                                3X Avg.Risk 23.4   11.0   Hemoglobin A1c     Status: Abnormal   Collection Time: 03/13/24  8:46 AM  Result Value Ref Range   Hgb A1c MFr Bld 6.5 (H) 4.8 - 5.6 %    Comment:          Prediabetes: 5.7 - 6.4          Diabetes: >6.4          Glycemic control for adults with diabetes: <7.0    Est. average glucose Bld gHb Est-mCnc 140 mg/dL  POCT CBG (Fasting - Glucose)     Status: Abnormal   Collection Time: 03/20/24 10:05 AM  Result Value Ref Range   Glucose Fasting, POC 133 (A) 70 - 99 mg/dL      Assessment & Plan:  Zachary Gordon was seen today for results.  Coronary artery disease involving native coronary artery of native heart without angina pectoris -     Ezetimibe ; Take 1 tablet (10 mg total) by mouth daily.  Dispense: 90 tablet; Refill: 1  Pre-diabetes -     POCT CBG (Fasting - Glucose)  Type 2 diabetes mellitus without complication, without long-term current use of insulin (HCC) -     Hemoglobin A1c  History of CAD (coronary artery disease)  Primary hypertension -     Lisinopril ; Take 1 tablet (20 mg total) by mouth every morning.  Dispense: 90 tablet; Refill: 1  Mixed hyperlipidemia -     Lipid panel -     Comprehensive metabolic panel with GFR -     CK; Future    Problem List Items Addressed This Visit       Cardiovascular and Mediastinum   Coronary artery disease involving native coronary artery of native heart without angina pectoris - Primary   Relevant Medications   ezetimibe  (ZETIA ) 10 MG tablet   lisinopril  (ZESTRIL ) 20 MG tablet   Primary hypertension   Relevant Medications   ezetimibe  (ZETIA ) 10 MG tablet   lisinopril  (ZESTRIL ) 20 MG tablet     Endocrine   Type 2 diabetes mellitus without complication, without long-term  current use of insulin (HCC)   Relevant Medications   lisinopril  (ZESTRIL ) 20 MG tablet     Other   Mixed hyperlipidemia   Relevant Medications   ezetimibe  (ZETIA ) 10 MG tablet  lisinopril  (ZESTRIL ) 20 MG tablet   Other Relevant Orders   Comprehensive metabolic panel with GFR   CK   Other Visit Diagnoses       Pre-diabetes       Relevant Orders   POCT CBG (Fasting - Glucose) (Completed)     History of CAD (coronary artery disease)           Return in about 4 months (around 07/18/2024) for fu with labs prior.   Total time spent: 20 minutes. This time includes review of previous notes and results and patient face to face interaction during today'Keona Bilyeu visit.    Sherrill Cinderella Perry, MD  03/20/2024   This document may have been prepared by [facility] Voice Recognition software and as such may include unintentional dictation errors.     [1]  Allergies Allergen Reactions   Penicillins Rash    Has patient had a PCN reaction causing immediate rash, facial/tongue/throat swelling, SOB or lightheadedness with hypotension: Yes Has patient had a PCN reaction causing severe rash involving mucus membranes or skin necrosis: No Has patient had a PCN reaction that required hospitalization: No Has patient had a PCN reaction occurring within the last 10 years: No If all of the above answers are NO, then may proceed with Cephalosporin use.   [2]  Outpatient Medications Prior to Visit  Medication Sig   amLODipine  (NORVASC ) 5 MG tablet Take 1 tablet (5 mg total) by mouth daily.   aspirin  EC 81 MG tablet Take 1 tablet (81 mg total) by mouth daily.   carvedilol  (COREG ) 6.25 MG tablet Take 1 tablet (6.25 mg total) by mouth 2 (two) times daily.   celecoxib (CELEBREX) 200 MG capsule TAKE 1 TO 2 CAPSULES EVERY DAY AS DIRECTED   clopidogrel  (PLAVIX ) 75 MG tablet Take 1 tablet (75 mg total) by mouth daily.   isosorbide  mononitrate (IMDUR ) 30 MG 24 hr tablet Take 1 tablet (30 mg total) by mouth  daily.   rosuvastatin  (CRESTOR ) 40 MG tablet Take 1 tablet (40 mg total) by mouth daily.   [DISCONTINUED] lisinopril  (ZESTRIL ) 20 MG tablet TAKE 1 TABLET EVERY MORNING   REPATHA  SURECLICK 140 MG/ML SOAJ Inject 140 mg into the skin every 14 (fourteen) days. (Patient not taking: Reported on 03/20/2024)   triamcinolone cream (KENALOG) 0.1 % APPLY TO THE AFFECTED AREA(Ica Daye) TWICE DAILY (Patient not taking: Reported on 03/20/2024)   [DISCONTINUED] lisinopril  (ZESTRIL ) 10 MG tablet Take 1 tablet (10 mg total) by mouth every morning. (Patient not taking: Reported on 03/20/2024)   No facility-administered medications prior to visit.   "

## 2024-07-31 ENCOUNTER — Ambulatory Visit: Admitting: Internal Medicine
